# Patient Record
Sex: Male | Born: 1995 | Race: Black or African American | Hispanic: No | Marital: Single | Smoking: Never smoker
Health system: Southern US, Community
[De-identification: ages and names within clinical notes are randomized; demographics above are authoritative.]

## PROBLEM LIST (undated history)

## (undated) DIAGNOSIS — K219 Gastro-esophageal reflux disease without esophagitis: Secondary | ICD-10-CM

---

## 2021-05-25 ENCOUNTER — Other Ambulatory Visit: Payer: Self-pay

## 2021-05-25 ENCOUNTER — Emergency Department (HOSPITAL_COMMUNITY): Payer: Self-pay

## 2021-05-25 ENCOUNTER — Emergency Department (HOSPITAL_COMMUNITY)
Admission: EM | Admit: 2021-05-25 | Discharge: 2021-05-25 | Disposition: A | Discharge status: Home / Self Care | Payer: Self-pay | Attending: Emergency Medicine | Admitting: Emergency Medicine

## 2021-05-25 DIAGNOSIS — Z5321 Procedure and treatment not carried out due to patient leaving prior to being seen by health care provider: Secondary | ICD-10-CM | POA: Insufficient documentation

## 2021-05-25 DIAGNOSIS — M79642 Pain in left hand: Secondary | ICD-10-CM | POA: Insufficient documentation
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
## 2021-05-25 NOTE — ED Notes (Signed)
Pt not responding for vital recheck. 

## 2021-05-25 NOTE — ED Notes (Signed)
Pt called x3 for vitals recheck, still no response. Moving pt OTF. 

## 2021-10-21 ENCOUNTER — Ambulatory Visit (HOSPITAL_COMMUNITY)
Admission: EM | Admit: 2021-10-21 | Discharge: 2021-10-21 | Disposition: A | Discharge status: Home / Self Care | Payer: Self-pay | Attending: Internal Medicine | Admitting: Internal Medicine

## 2021-10-21 ENCOUNTER — Ambulatory Visit (INDEPENDENT_AMBULATORY_CARE_PROVIDER_SITE_OTHER): Payer: Self-pay

## 2021-10-21 ENCOUNTER — Encounter (HOSPITAL_COMMUNITY): Payer: Self-pay

## 2021-10-21 DIAGNOSIS — R1110 Vomiting, unspecified: Secondary | ICD-10-CM

## 2021-10-21 DIAGNOSIS — K5900 Constipation, unspecified: Secondary | ICD-10-CM

## 2021-10-21 DIAGNOSIS — R109 Unspecified abdominal pain: Secondary | ICD-10-CM

## 2021-10-21 DIAGNOSIS — R1084 Generalized abdominal pain: Secondary | ICD-10-CM

## 2021-10-21 MED ORDER — ONDANSETRON HCL 4 MG PO TABS: 4.0000 mg | ORAL_TABLET | Freq: Three times a day (TID) | ORAL | 0 refills | Status: DC | PRN

## 2021-10-21 NOTE — ED Triage Notes (Signed)
2-3 days ago, Pt reports after he ate from a restaurant he began to have abdominal pain, but could not have a BM. Today, Pt took pepto bismol and has emesis x2.  ?No diarrhea.  ?

## 2021-10-21 NOTE — ED Provider Notes (Signed)
?[facility] ? ? ? ?CSN: [id number] ?Arrival date & time: 10/21/21  1026 ? ? ?  ? ?History   ?Chief Complaint ?Chief Complaint  ?Patient presents with  ? Constipation  ? ? ?HPI ?Brad Lyons is a 26 y.o. male.  ? ?Abdominal pain ?Patient reports that for the last 2 to 3 days he has been having worsening abdominal pain ?This initially started after having a jalapeno cheeseburger ?States that he ate a steak the following day and states that he continued to have pain ?He states that the eating itself does not specifically worsen the pain, but he does not much of an appetite ?He has not had a bowel movement in the last 2 days ?States that the bowel movement was normal that time ?Denies any diarrhea ?States the pain worsened last night ?Pain is mostly in the epigastric region ?Started vomiting this morning, has had 2 episodes of emesis that is nonbloody per his report ?He tried some Pepto-Bismol without any improvement ?He is most bothered by the abdominal pain today ?He denies any fevers or chills ?No known sick contacts ?Reports normal urination has been drinking normally ?States that he has been able to eat small amounts, this does not specifically worsen the pain, but he does not feel like eating ? ? ?History reviewed. No pertinent past medical history. ? ?There are no problems to display for this patient. ? ? ?History reviewed. No pertinent surgical history. ? ? ? ? ?Home Medications   ? ?Prior to Admission medications   ?Medication Sig Start Date End Date Taking? Authorizing Provider  ?ondansetron (ZOFRAN) 4 MG tablet Take 1 tablet (4 mg total) by mouth every 8 (eight) hours as needed for nausea or vomiting. 10/21/21  Yes Katrena Stehlin, Bernita Raisin, DO  ? ? ?Family History ?Family History  ?Problem Relation Age of Onset  ? Healthy Father   ? ? ?Social History ?Social History  ? ?Tobacco Use  ? Smoking status: Every Day  ?  Types: Cigarettes, Cigars  ? Smokeless tobacco: Never  ? ? ? ?Allergies   ?Patient has no  known allergies. ? ? ?Review of Systems ?Review of Systems  ?All other systems reviewed and are negative. ?Per HPI ? ?Physical Exam ?Triage Vital Signs ?ED Triage Vitals  ?Enc Vitals Group  ?   BP 10/21/21 1054 (!) 148/102  ?   Pulse Rate 10/21/21 1054 85  ?   Resp 10/21/21 1054 20  ?   Temp 10/21/21 1054 98.2 ?F (36.8 ?C)  ?   Temp Source 10/21/21 1054 Oral  ?   SpO2 10/21/21 1054 97 %  ?   Weight --   ?   Height --   ?   Head Circumference --   ?   Peak Flow --   ?   Pain Score 10/21/21 1057 8  ?   Pain Loc --   ?   Pain Edu? --   ?   Excl. in [place]? --   ? ?No data found. ? ?Updated Vital Signs ?BP (!) 148/102 (BP Location: Right Arm)   Pulse 85   Temp 98.2 ?F (36.8 ?C) (Oral)   Resp 20   SpO2 97%  ? ?Visual Acuity ?Right Eye Distance:   ?Left Eye Distance:   ?Bilateral Distance:   ? ?Right Eye Near:   ?Left Eye Near:    ?Bilateral Near:    ? ?Physical Exam ?Constitutional:   ?   General: He is not in acute distress. ?  Appearance: Normal appearance. He is not ill-appearing or toxic-appearing.  ?HENT:  ?   Head: Normocephalic and atraumatic.  ?   Mouth/Throat:  ?   Mouth: Mucous membranes are moist.  ?Eyes:  ?   Conjunctiva/sclera: Conjunctivae normal.  ?Cardiovascular:  ?   Rate and Rhythm: Normal rate.  ?Pulmonary:  ?   Effort: Pulmonary effort is normal. No respiratory distress.  ?Abdominal:  ?   Palpations: Abdomen is soft.  ?   Tenderness: There is abdominal tenderness (diffuse, worse in b/l lower quadrants). There is no guarding or rebound.  ?   Comments: Bowel sounds difficult to auscultate  ?Musculoskeletal:  ?   Cervical back: Normal range of motion.  ?Skin: ?   General: Skin is warm and dry.  ?   Capillary Refill: Capillary refill takes less than 2 seconds.  ?Neurological:  ?   Mental Status: He is alert and oriented to person, place, and time.  ?Psychiatric:     ?   Mood and Affect: Mood normal.     ?   Behavior: Behavior normal.  ? ? ? ?UC Treatments / Results  ?Labs ?(all labs ordered are listed,  but only abnormal results are displayed) ?Labs Reviewed - No data to display ? ?EKG ? ? ?Radiology ?DG Abd 1 View ? ?Result Date: 10/21/2021 ?CLINICAL DATA:  Abdominal pain and vomiting. EXAM: ABDOMEN - 1 VIEW COMPARISON:  None. FINDINGS: Stool is seen in the cecum and ascending colon. Relative paucity of bowel gas elsewhere. IMPRESSION: Stool in the cecum and ascending colon may reflect constipation. Electronically Signed   By: Lorin Picket M.D.   On: 10/21/2021 11:15   ? ?Procedures ?Procedures (including critical care time) ? ?Medications Ordered in UC ?Medications - No data to display ? ?Initial Impression / Assessment and Plan / UC Course  ?I have reviewed the triage vital signs and the nursing notes. ? ?Pertinent labs & imaging results that were available during my care of the patient were reviewed by me and considered in my medical decision making (see chart for details). ? ?  ? ?Some stool noted on KUB but no specific findings of obstruction.  Recommend MiraLAX over-the-counter for constipation, 1-2 capfuls daily until bowel movement is obtained.  Recommend brat diet and good hydration.  Rx provided for Zofran to use as needed for nausea to maintain good p.o. intake.  If not improving, especially if pain significantly worsens, he is unable to eat or drink, he develops fever, recommend follow-up with a medical provider. ? ? ?Final Clinical Impressions(s) / UC Diagnoses  ? ?Final diagnoses:  ?Constipation, unspecified constipation type  ?Generalized abdominal pain  ? ? ? ?Discharge Instructions   ? ?  ?As we discussed, you should get MiraLAX which you can get over-the-counter at the pharmacy.  Take 1 capful today and then tonight if you have still not had a bowel movement you can take another.  You can take 1-2 capfuls a day until you have a bowel movement.  Make sure that you stay well-hydrated.  Eat a bland diet.  Things like bananas, rice, apples, toast are good for abdominal pain.  If you have significant  worsening of your pain, you are vomiting more, you develop a fever, you are still unable to have a bowel movement over the next few days, you should be seen by a medical provider again. ? ? ? ? ?ED Prescriptions   ? ? Medication Sig Dispense Auth. Provider  ? ondansetron [facility]) 4  MG tablet Take 1 tablet (4 mg total) by mouth every 8 (eight) hours as needed for nausea or vomiting. 12 tablet Vernell Townley, Bernita Raisin, DO  ? ?  ? ?PDMP not reviewed this encounter. ?  ?Cleophas Dunker, DO ?10/21/21 1139 ? ?

## 2021-10-21 NOTE — Discharge Instructions (Addendum)
As we discussed, you should get MiraLAX which you can get over-the-counter at the pharmacy.  Take 1 capful today and then tonight if you have still not had a bowel movement you can take another.  You can take 1-2 capfuls a day until you have a bowel movement.  Make sure that you stay well-hydrated.  Eat a bland diet.  Things like bananas, rice, apples, toast are good for abdominal pain.  If you have significant worsening of your pain, you are vomiting more, you develop a fever, you are still unable to have a bowel movement over the next few days, you should be seen by a medical provider again. ?

## 2021-10-25 ENCOUNTER — Other Ambulatory Visit: Payer: Self-pay

## 2021-10-25 ENCOUNTER — Encounter (HOSPITAL_COMMUNITY): Payer: Self-pay | Admitting: Emergency Medicine

## 2021-10-25 ENCOUNTER — Emergency Department (HOSPITAL_COMMUNITY): Payer: Self-pay | Admitting: Anesthesiology

## 2021-10-25 ENCOUNTER — Encounter (HOSPITAL_COMMUNITY): Admission: EM | Disposition: A | Discharge status: Home / Self Care | Payer: Self-pay | Source: Home / Self Care

## 2021-10-25 ENCOUNTER — Inpatient Hospital Stay (HOSPITAL_COMMUNITY): Payer: Self-pay

## 2021-10-25 ENCOUNTER — Inpatient Hospital Stay (HOSPITAL_COMMUNITY)
Admission: EM | Admit: 2021-10-25 | Discharge: 2021-11-03 | DRG: 339 | Disposition: A | Discharge status: Home / Self Care | Payer: Self-pay | Attending: Surgery | Admitting: Surgery

## 2021-10-25 ENCOUNTER — Emergency Department (HOSPITAL_COMMUNITY): Payer: Self-pay

## 2021-10-25 DIAGNOSIS — K3533 Acute appendicitis with perforation and localized peritonitis, with abscess: Principal | ICD-10-CM | POA: Diagnosis present

## 2021-10-25 DIAGNOSIS — J90 Pleural effusion, not elsewhere classified: Secondary | ICD-10-CM | POA: Diagnosis not present

## 2021-10-25 DIAGNOSIS — E860 Dehydration: Secondary | ICD-10-CM | POA: Diagnosis present

## 2021-10-25 DIAGNOSIS — F17210 Nicotine dependence, cigarettes, uncomplicated: Secondary | ICD-10-CM | POA: Diagnosis present

## 2021-10-25 DIAGNOSIS — E876 Hypokalemia: Secondary | ICD-10-CM | POA: Diagnosis not present

## 2021-10-25 DIAGNOSIS — K56609 Unspecified intestinal obstruction, unspecified as to partial versus complete obstruction: Secondary | ICD-10-CM | POA: Diagnosis present

## 2021-10-25 DIAGNOSIS — Z6838 Body mass index (BMI) 38.0-38.9, adult: Secondary | ICD-10-CM

## 2021-10-25 DIAGNOSIS — K3532 Acute appendicitis with perforation and localized peritonitis, without abscess: Secondary | ICD-10-CM

## 2021-10-25 DIAGNOSIS — E669 Obesity, unspecified: Secondary | ICD-10-CM | POA: Diagnosis present

## 2021-10-25 DIAGNOSIS — N179 Acute kidney failure, unspecified: Secondary | ICD-10-CM | POA: Diagnosis present

## 2021-10-25 DIAGNOSIS — K219 Gastro-esophageal reflux disease without esophagitis: Secondary | ICD-10-CM | POA: Diagnosis present

## 2021-10-25 DIAGNOSIS — K567 Ileus, unspecified: Secondary | ICD-10-CM | POA: Diagnosis not present

## 2021-10-25 HISTORY — PX: LAPAROSCOPY: SHX197

## 2021-10-25 HISTORY — DX: Gastro-esophageal reflux disease without esophagitis: K21.9

## 2021-10-25 HISTORY — PX: LAPAROSCOPIC APPENDECTOMY: SHX408

## 2021-10-25 LAB — URINALYSIS, ROUTINE W REFLEX MICROSCOPIC
Bacteria, UA: NONE SEEN
Bilirubin Urine: NEGATIVE
Glucose, UA: NEGATIVE mg/dL
Ketones, ur: 5 mg/dL — AB
Leukocytes,Ua: NEGATIVE
Nitrite: NEGATIVE
Protein, ur: 30 mg/dL — AB
Specific Gravity, Urine: >1.046 — ABNORMAL HIGH (ref 1.005–1.030)
pH: 6 (ref 5.0–8.0)

## 2021-10-25 LAB — COMPREHENSIVE METABOLIC PANEL
ALT: 26 U/L (ref 0–44)
AST: 21 U/L (ref 15–41)
Albumin: 3.7 g/dL (ref 3.5–5.0)
Alkaline Phosphatase: 96 U/L (ref 38–126)
Anion gap: 16 — ABNORMAL HIGH (ref 5–15)
BUN: 22 mg/dL — ABNORMAL HIGH (ref 6–20)
CO2: 25 mmol/L (ref 22–32)
Calcium: 9.5 mg/dL (ref 8.9–10.3)
Chloride: 90 mmol/L — ABNORMAL LOW (ref 98–111)
Creatinine, Ser: 1.8 mg/dL — ABNORMAL HIGH (ref 0.61–1.24)
GFR, Estimated: 52 mL/min — ABNORMAL LOW (ref >60)
Glucose, Bld: 171 mg/dL — ABNORMAL HIGH (ref 70–99)
Potassium: 3.5 mmol/L (ref 3.5–5.1)
Sodium: 131 mmol/L — ABNORMAL LOW (ref 135–145)
Total Bilirubin: 1.9 mg/dL — ABNORMAL HIGH (ref 0.3–1.2)
Total Protein: 8.6 g/dL — ABNORMAL HIGH (ref 6.5–8.1)

## 2021-10-25 LAB — CBC
HCT: 48.3 % (ref 39.0–52.0)
Hemoglobin: 17.8 g/dL — ABNORMAL HIGH (ref 13.0–17.0)
MCH: 29.5 pg (ref 26.0–34.0)
MCHC: 36.9 g/dL — ABNORMAL HIGH (ref 30.0–36.0)
MCV: 80.1 fL (ref 80.0–100.0)
Platelets: 341 10*3/uL (ref 150–400)
RBC: 6.03 MIL/uL — ABNORMAL HIGH (ref 4.22–5.81)
RDW: 12.7 % (ref 11.5–15.5)
WBC: 13.4 10*3/uL — ABNORMAL HIGH (ref 4.0–10.5)
nRBC: 0 % (ref 0.0–0.2)

## 2021-10-25 LAB — LIPASE, BLOOD: Lipase: 17 U/L (ref 11–51)

## 2021-10-25 SURGERY — LAPAROSCOPY, DIAGNOSTIC
Anesthesia: General | Site: Abdomen

## 2021-10-25 MED ORDER — KETAMINE HCL 10 MG/ML IJ SOLN
INTRAMUSCULAR | Status: DC | PRN
  Administered 2021-10-25: 30 mg via INTRAVENOUS
  Administered 2021-10-25: 20 mg via INTRAVENOUS

## 2021-10-25 MED ORDER — ACETAMINOPHEN 160 MG/5ML PO SOLN: 1000.0000 mg | Freq: Once | ORAL | Status: DC | PRN

## 2021-10-25 MED ORDER — ONDANSETRON HCL 4 MG/2ML IJ SOLN
INTRAMUSCULAR | Status: AC
  Filled 2021-10-25: qty 2

## 2021-10-25 MED ORDER — PROPOFOL 10 MG/ML IV BOLUS
INTRAVENOUS | Status: DC | PRN
  Administered 2021-10-25: 180 mg via INTRAVENOUS

## 2021-10-25 MED ORDER — DIPHENHYDRAMINE HCL 50 MG/ML IJ SOLN: 25.0000 mg | Freq: Four times a day (QID) | INTRAMUSCULAR | Status: DC | PRN

## 2021-10-25 MED ORDER — LACTATED RINGERS IV SOLN: INTRAVENOUS | Status: DC

## 2021-10-25 MED ORDER — ONDANSETRON HCL 4 MG/2ML IJ SOLN
4.0000 mg | Freq: Four times a day (QID) | INTRAMUSCULAR | Status: DC | PRN
  Administered 2021-10-25 – 2021-10-26 (×2): 4 mg via INTRAVENOUS
  Filled 2021-10-25: qty 2

## 2021-10-25 MED ORDER — PIPERACILLIN-TAZOBACTAM 3.375 G IVPB 30 MIN
3.3750 g | Freq: Once | INTRAVENOUS | Status: AC
  Administered 2021-10-25: 3.375 g via INTRAVENOUS
  Filled 2021-10-25: qty 50

## 2021-10-25 MED ORDER — ARTIFICIAL TEARS OPHTHALMIC OINT
TOPICAL_OINTMENT | OPHTHALMIC | Status: AC
  Filled 2021-10-25: qty 3.5

## 2021-10-25 MED ORDER — SUGAMMADEX SODIUM 200 MG/2ML IV SOLN
INTRAVENOUS | Status: DC | PRN
  Administered 2021-10-25: 300 mg via INTRAVENOUS

## 2021-10-25 MED ORDER — SUCCINYLCHOLINE CHLORIDE 200 MG/10ML IV SOSY
PREFILLED_SYRINGE | INTRAVENOUS | Status: AC
  Filled 2021-10-25: qty 10

## 2021-10-25 MED ORDER — MIDAZOLAM HCL 2 MG/2ML IJ SOLN
INTRAMUSCULAR | Status: DC | PRN
  Administered 2021-10-25: 2 mg via INTRAVENOUS

## 2021-10-25 MED ORDER — PIPERACILLIN-TAZOBACTAM 3.375 G IVPB
3.3750 g | Freq: Three times a day (TID) | INTRAVENOUS | Status: AC
  Administered 2021-10-25 – 2021-10-28 (×9): 3.375 g via INTRAVENOUS
  Filled 2021-10-25 (×9): qty 50

## 2021-10-25 MED ORDER — BUPIVACAINE-EPINEPHRINE (PF) 0.25% -1:200000 IJ SOLN
INTRAMUSCULAR | Status: AC
  Filled 2021-10-25: qty 30

## 2021-10-25 MED ORDER — OXYCODONE HCL 5 MG/5ML PO SOLN: 5.0000 mg | Freq: Once | ORAL | Status: DC | PRN

## 2021-10-25 MED ORDER — ONDANSETRON HCL 4 MG/2ML IJ SOLN
4.0000 mg | Freq: Once | INTRAMUSCULAR | Status: AC
Start: 2021-10-25 — End: 2021-10-25
  Administered 2021-10-25: 4 mg via INTRAVENOUS
  Filled 2021-10-25: qty 2

## 2021-10-25 MED ORDER — 0.9 % SODIUM CHLORIDE (POUR BTL) OPTIME
TOPICAL | Status: DC | PRN
  Administered 2021-10-25: 3000 mL

## 2021-10-25 MED ORDER — SODIUM CHLORIDE 0.9 % IR SOLN
Status: DC | PRN
  Administered 2021-10-25: 3000 mL

## 2021-10-25 MED ORDER — CHLORHEXIDINE GLUCONATE 0.12 % MT SOLN: 15.0000 mL | Freq: Once | OROMUCOSAL | Status: AC

## 2021-10-25 MED ORDER — MORPHINE SULFATE (PF) 2 MG/ML IV SOLN
2.0000 mg | INTRAVENOUS | Status: DC | PRN
  Filled 2021-10-25: qty 1

## 2021-10-25 MED ORDER — ACETAMINOPHEN 10 MG/ML IV SOLN
INTRAVENOUS | Status: DC | PRN
  Administered 2021-10-25: 1000 mg via INTRAVENOUS

## 2021-10-25 MED ORDER — ONDANSETRON HCL 4 MG/2ML IJ SOLN
4.0000 mg | Freq: Four times a day (QID) | INTRAMUSCULAR | Status: DC | PRN
  Filled 2021-10-25 (×2): qty 2

## 2021-10-25 MED ORDER — ORAL CARE MOUTH RINSE: 15.0000 mL | Freq: Once | OROMUCOSAL | Status: AC

## 2021-10-25 MED ORDER — ENOXAPARIN SODIUM 60 MG/0.6ML IJ SOSY
55.0000 mg | PREFILLED_SYRINGE | INTRAMUSCULAR | Status: DC
  Administered 2021-10-26 – 2021-10-30 (×4): 55 mg via SUBCUTANEOUS
  Filled 2021-10-25 (×4): qty 0.6

## 2021-10-25 MED ORDER — METHOCARBAMOL 1000 MG/10ML IJ SOLN
500.0000 mg | Freq: Three times a day (TID) | INTRAVENOUS | Status: DC | PRN
  Filled 2021-10-25: qty 5

## 2021-10-25 MED ORDER — MIDAZOLAM HCL 2 MG/2ML IJ SOLN
INTRAMUSCULAR | Status: AC
  Filled 2021-10-25: qty 2

## 2021-10-25 MED ORDER — SUCCINYLCHOLINE CHLORIDE 200 MG/10ML IV SOSY
PREFILLED_SYRINGE | INTRAVENOUS | Status: DC | PRN
  Administered 2021-10-25: 160 mg via INTRAVENOUS

## 2021-10-25 MED ORDER — DIPHENHYDRAMINE HCL 25 MG PO CAPS
25.0000 mg | ORAL_CAPSULE | Freq: Four times a day (QID) | ORAL | Status: DC | PRN
  Filled 2021-10-25: qty 1

## 2021-10-25 MED ORDER — FENTANYL CITRATE (PF) 100 MCG/2ML IJ SOLN
25.0000 ug | INTRAMUSCULAR | Status: DC | PRN
  Administered 2021-10-25 (×2): 25 ug via INTRAVENOUS

## 2021-10-25 MED ORDER — SODIUM CHLORIDE 0.9 % IV BOLUS
1000.0000 mL | Freq: Once | INTRAVENOUS | Status: AC
  Administered 2021-10-25: 1000 mL via INTRAVENOUS

## 2021-10-25 MED ORDER — ACETAMINOPHEN 10 MG/ML IV SOLN
1000.0000 mg | Freq: Four times a day (QID) | INTRAVENOUS | Status: AC
  Administered 2021-10-25 – 2021-10-26 (×4): 1000 mg via INTRAVENOUS
  Filled 2021-10-25 (×4): qty 100

## 2021-10-25 MED ORDER — ACETAMINOPHEN 10 MG/ML IV SOLN: 1000.0000 mg | Freq: Once | INTRAVENOUS | Status: DC | PRN

## 2021-10-25 MED ORDER — OXYCODONE HCL 5 MG PO TABS: 5.0000 mg | ORAL_TABLET | Freq: Once | ORAL | Status: DC | PRN

## 2021-10-25 MED ORDER — IOHEXOL 300 MG/ML  SOLN
100.0000 mL | Freq: Once | INTRAMUSCULAR | Status: AC | PRN
  Administered 2021-10-25: 100 mL via INTRAVENOUS

## 2021-10-25 MED ORDER — DEXAMETHASONE SODIUM PHOSPHATE 10 MG/ML IJ SOLN
INTRAMUSCULAR | Status: DC | PRN
  Administered 2021-10-25: 10 mg via INTRAVENOUS

## 2021-10-25 MED ORDER — FENTANYL CITRATE (PF) 100 MCG/2ML IJ SOLN
INTRAMUSCULAR | Status: AC
  Filled 2021-10-25: qty 2

## 2021-10-25 MED ORDER — MORPHINE SULFATE (PF) 2 MG/ML IV SOLN: 2.0000 mg | INTRAVENOUS | Status: DC | PRN

## 2021-10-25 MED ORDER — ACETAMINOPHEN 10 MG/ML IV SOLN
INTRAVENOUS | Status: AC
  Filled 2021-10-25: qty 100

## 2021-10-25 MED ORDER — CHLORHEXIDINE GLUCONATE 0.12 % MT SOLN
OROMUCOSAL | Status: AC
  Administered 2021-10-25: 15 mL via OROMUCOSAL
  Filled 2021-10-25: qty 15

## 2021-10-25 MED ORDER — BUPIVACAINE HCL (PF) 0.25 % IJ SOLN
INTRAMUSCULAR | Status: AC
  Filled 2021-10-25: qty 30

## 2021-10-25 MED ORDER — ONDANSETRON 4 MG PO TBDP: 4.0000 mg | ORAL_TABLET | Freq: Four times a day (QID) | ORAL | Status: DC | PRN

## 2021-10-25 MED ORDER — KETAMINE HCL 50 MG/5ML IJ SOSY
PREFILLED_SYRINGE | INTRAMUSCULAR | Status: AC
  Filled 2021-10-25: qty 5

## 2021-10-25 MED ORDER — LIDOCAINE 2% (20 MG/ML) 5 ML SYRINGE
INTRAMUSCULAR | Status: AC
  Filled 2021-10-25: qty 5

## 2021-10-25 MED ORDER — LIDOCAINE 2% (20 MG/ML) 5 ML SYRINGE
INTRAMUSCULAR | Status: DC | PRN
  Administered 2021-10-25: 80 mg via INTRAVENOUS

## 2021-10-25 MED ORDER — ROCURONIUM BROMIDE 10 MG/ML (PF) SYRINGE
PREFILLED_SYRINGE | INTRAVENOUS | Status: AC
  Filled 2021-10-25: qty 10

## 2021-10-25 MED ORDER — FENTANYL CITRATE (PF) 250 MCG/5ML IJ SOLN
INTRAMUSCULAR | Status: AC
  Filled 2021-10-25: qty 5

## 2021-10-25 MED ORDER — SODIUM CHLORIDE 0.9 % IV SOLN: INTRAVENOUS | Status: DC

## 2021-10-25 MED ORDER — PROPOFOL 10 MG/ML IV BOLUS
INTRAVENOUS | Status: AC
  Filled 2021-10-25: qty 20

## 2021-10-25 MED ORDER — FENTANYL CITRATE (PF) 250 MCG/5ML IJ SOLN
INTRAMUSCULAR | Status: DC | PRN
  Administered 2021-10-25: 50 ug via INTRAVENOUS
  Administered 2021-10-25: 150 ug via INTRAVENOUS
  Administered 2021-10-25: 50 ug via INTRAVENOUS

## 2021-10-25 MED ORDER — HYDRALAZINE HCL 20 MG/ML IJ SOLN: 10.0000 mg | INTRAMUSCULAR | Status: DC | PRN

## 2021-10-25 MED ORDER — ONDANSETRON HCL 4 MG/2ML IJ SOLN
INTRAMUSCULAR | Status: DC | PRN
  Administered 2021-10-25: 4 mg via INTRAVENOUS

## 2021-10-25 MED ORDER — DEXAMETHASONE SODIUM PHOSPHATE 10 MG/ML IJ SOLN
INTRAMUSCULAR | Status: AC
  Filled 2021-10-25: qty 1

## 2021-10-25 MED ORDER — ACETAMINOPHEN 500 MG PO TABS: 1000.0000 mg | ORAL_TABLET | Freq: Once | ORAL | Status: DC | PRN

## 2021-10-25 MED ORDER — ROCURONIUM BROMIDE 10 MG/ML (PF) SYRINGE
PREFILLED_SYRINGE | INTRAVENOUS | Status: DC | PRN
  Administered 2021-10-25 (×2): 50 mg via INTRAVENOUS

## 2021-10-25 MED ORDER — BUPIVACAINE-EPINEPHRINE 0.25% -1:200000 IJ SOLN
INTRAMUSCULAR | Status: DC | PRN
  Administered 2021-10-25: 10 mL
  Administered 2021-10-25: 5 mL

## 2021-10-25 MED ORDER — CHLORHEXIDINE GLUCONATE CLOTH 2 % EX PADS
6.0000 | MEDICATED_PAD | Freq: Every day | CUTANEOUS | Status: DC
  Administered 2021-10-26: 6 via TOPICAL

## 2021-10-25 SURGICAL SUPPLY — 70 items
BAG COUNTER SPONGE SURGICOUNT (BAG) ×3 IMPLANT
BIOPATCH BLUE 3/4IN DISK W/1.5 (GAUZE/BANDAGES/DRESSINGS) ×1 IMPLANT
BLADE CLIPPER SURG (BLADE) ×1 IMPLANT
CANISTER SUCT 3000ML PPV (MISCELLANEOUS) ×3 IMPLANT
CHLORAPREP W/TINT 26 (MISCELLANEOUS) ×3 IMPLANT
COVER SURGICAL LIGHT HANDLE (MISCELLANEOUS) ×3 IMPLANT
CUTTER FLEX LINEAR 45M (STAPLE) ×1 IMPLANT
DEFOGGER ANTIFOG KIT (MISCELLANEOUS) ×1 IMPLANT
DERMABOND ADVANCED (GAUZE/BANDAGES/DRESSINGS) ×1
DERMABOND ADVANCED .7 DNX12 (GAUZE/BANDAGES/DRESSINGS) ×2 IMPLANT
DRAIN CHANNEL 19F RND (DRAIN) ×1 IMPLANT
DRAPE LAPAROSCOPIC ABDOMINAL (DRAPES) ×3 IMPLANT
DRAPE WARM FLUID 44X44 (DRAPES) ×3 IMPLANT
DRSG OPSITE POSTOP 4X10 (GAUZE/BANDAGES/DRESSINGS) IMPLANT
DRSG OPSITE POSTOP 4X8 (GAUZE/BANDAGES/DRESSINGS) IMPLANT
DRSG TEGADERM 2-3/8X2-3/4 SM (GAUZE/BANDAGES/DRESSINGS) ×1 IMPLANT
ELECT BLADE 6.5 EXT (BLADE) IMPLANT
ELECT CAUTERY BLADE 6.4 (BLADE) ×3 IMPLANT
ELECT REM PT RETURN 9FT ADLT (ELECTROSURGICAL) ×3
ELECTRODE REM PT RTRN 9FT ADLT (ELECTROSURGICAL) ×2 IMPLANT
EVACUATOR SILICONE 100CC (DRAIN) ×1 IMPLANT
GLOVE SRG 8 PF TXTR STRL LF DI (GLOVE) ×2 IMPLANT
GLOVE SURG ENC MOIS LTX SZ7.5 (GLOVE) ×3 IMPLANT
GLOVE SURG UNDER POLY LF SZ8 (GLOVE) ×1
GOWN STRL REUS W/ TWL LRG LVL3 (GOWN DISPOSABLE) ×4 IMPLANT
GOWN STRL REUS W/ TWL XL LVL3 (GOWN DISPOSABLE) ×2 IMPLANT
GOWN STRL REUS W/TWL LRG LVL3 (GOWN DISPOSABLE) ×2
GOWN STRL REUS W/TWL XL LVL3 (GOWN DISPOSABLE) ×1
HANDLE SUCTION POOLE (INSTRUMENTS) ×2 IMPLANT
KIT BASIN OR (CUSTOM PROCEDURE TRAY) ×3 IMPLANT
KIT TURNOVER KIT B (KITS) ×3 IMPLANT
LIGASURE IMPACT 36 18CM CVD LR (INSTRUMENTS) IMPLANT
NDL HYPO 25GX1X1/2 BEV (NEEDLE) IMPLANT
NDL INSUFFLATION 14GA 120MM (NEEDLE) ×2 IMPLANT
NEEDLE HYPO 25GX1X1/2 BEV (NEEDLE) ×3 IMPLANT
NEEDLE INSUFFLATION 14GA 120MM (NEEDLE) ×3 IMPLANT
NS IRRIG 1000ML POUR BTL (IV SOLUTION) ×7 IMPLANT
PACK GENERAL/GYN (CUSTOM PROCEDURE TRAY) ×3 IMPLANT
PAD ARMBOARD 7.5X6 YLW CONV (MISCELLANEOUS) ×6 IMPLANT
PENCIL SMOKE EVACUATOR (MISCELLANEOUS) ×3 IMPLANT
POUCH RETRIEVAL ECOSAC 10 (ENDOMECHANICALS) IMPLANT
POUCH RETRIEVAL ECOSAC 10MM (ENDOMECHANICALS) ×1
RELOAD 45 VASCULAR/THIN (ENDOMECHANICALS) ×6 IMPLANT
RELOAD STAPLE 45 2.5 WHT GRN (ENDOMECHANICALS) IMPLANT
SCISSORS LAP 5X35 DISP (ENDOMECHANICALS) ×1 IMPLANT
SET IRRIG TUBING LAPAROSCOPIC (IRRIGATION / IRRIGATOR) ×1 IMPLANT
SET TUBE SMOKE EVAC HIGH FLOW (TUBING) ×3 IMPLANT
SHEARS HARMONIC ACE PLUS 36CM (ENDOMECHANICALS) ×1 IMPLANT
SLEEVE ENDOPATH XCEL 5M (ENDOMECHANICALS) ×4 IMPLANT
SPECIMEN JAR LARGE (MISCELLANEOUS) ×1 IMPLANT
SPONGE T-LAP 18X18 ~~LOC~~+RFID (SPONGE) ×3 IMPLANT
STAPLER VISISTAT 35W (STAPLE) ×3 IMPLANT
SUCTION POOLE HANDLE (INSTRUMENTS) ×3
SUT MNCRL AB 4-0 PS2 18 (SUTURE) ×3 IMPLANT
SUT PDS AB 1 TP1 54 (SUTURE) ×4 IMPLANT
SUT SILK 2 0 SH CR/8 (SUTURE) ×3 IMPLANT
SUT SILK 2 0 TIES 10X30 (SUTURE) ×3 IMPLANT
SUT SILK 3 0 SH CR/8 (SUTURE) ×3 IMPLANT
SUT SILK 3 0 TIES 10X30 (SUTURE) ×3 IMPLANT
SUT VICRYL 0 AB UR-6 (SUTURE) ×1 IMPLANT
TOWEL GREEN STERILE (TOWEL DISPOSABLE) ×3 IMPLANT
TOWEL GREEN STERILE FF (TOWEL DISPOSABLE) ×3 IMPLANT
TRAY FOLEY MTR SLVR 16FR STAT (SET/KITS/TRAYS/PACK) ×3 IMPLANT
TRAY LAPAROSCOPIC MC (CUSTOM PROCEDURE TRAY) ×3 IMPLANT
TROCAR XCEL 12X100 BLDLESS (ENDOMECHANICALS) ×1 IMPLANT
TROCAR XCEL BLUNT TIP 100MML (ENDOMECHANICALS) IMPLANT
TROCAR XCEL NON-BLD 11X100MML (ENDOMECHANICALS) IMPLANT
TROCAR XCEL NON-BLD 5MMX100MML (ENDOMECHANICALS) ×3 IMPLANT
WARMER LAPAROSCOPE (MISCELLANEOUS) ×3 IMPLANT
YANKAUER SUCT BULB TIP NO VENT (SUCTIONS) ×1 IMPLANT

## 2021-10-25 NOTE — Anesthesia Preprocedure Evaluation (Signed)
Anesthesia Evaluation  ?Patient identified by MRN, date of birth, ID band ?Patient awake ? ? ? ?Reviewed: ?Allergy & Precautions, NPO status , Patient's Chart, lab work & pertinent test results ? ?History of Anesthesia Complications ?Negative for: history of anesthetic complications ? ?Airway ?Mallampati: I ? ?TM Distance: >3 FB ?Neck ROM: Full ? ? ? Dental ? ?(+) Teeth Intact, Dental Advisory Given ?  ?Pulmonary ?neg shortness of breath, neg COPD, neg recent URI, Current Smoker and Patient abstained from smoking.,  ?  ?breath sounds clear to auscultation ? ? ? ? ? ? Cardiovascular ?negative cardio ROS ? ? ?Rhythm:Regular  ? ?  ?Neuro/Psych ?negative neurological ROS ? negative psych ROS  ? GI/Hepatic ?Neg liver ROS, perforated appendicitis ?  ?Endo/Other  ?negative endocrine ROS ? Renal/GU ?negative Renal ROS  ? ?  ?Musculoskeletal ?negative musculoskeletal ROS ?(+)  ? Abdominal ?  ?Peds ? Hematology ?negative hematology ROS ?(+)   ?Anesthesia Other Findings ? ? Reproductive/Obstetrics ? ?  ? ? ? ? ? ? ? ? ? ? ? ? ? ?  ?  ? ? ? ? ? ? ? ? ?Anesthesia Physical ?Anesthesia Plan ? ?ASA: 1 ? ?Anesthesia Plan: General  ? ?Post-op Pain Management: Toradol IV (intra-op)* and Ofirmev IV (intra-op)*  ? ?Induction: Rapid sequence and Cricoid pressure planned ? ?PONV Risk Score and Plan: 1 and Ondansetron and Dexamethasone ? ?Airway Management Planned: Oral ETT ? ?Additional Equipment: None ? ?Intra-op Plan:  ? ?Post-operative Plan: Extubation in OR ? ?Informed Consent: I have reviewed the patients History and Physical, chart, labs and discussed the procedure including the risks, benefits and alternatives for the proposed anesthesia with the patient or authorized representative who has indicated his/her understanding and acceptance.  ? ? ? ?Dental advisory given ? ?Plan Discussed with: CRNA ? ?Anesthesia Plan Comments:   ? ? ? ? ? ? ?Anesthesia Quick Evaluation ? ?

## 2021-10-25 NOTE — ED Provider Notes (Signed)
?[facility] ?Provider Note ? ? ?CSN: [id number] ?Arrival date & time: 10/25/21  0617 ? ?  ? ?History ? ?Chief Complaint  ?Patient presents with  ? Emesis  ? ? ?Brad Lyons is a 26 y.o. male. ? ?Pt began having abdominal pain on 4/2.  Pt treated for constipation with miralax.  Pt reports constipation resolved but abdominal pain has increased.  Pt complains of vomiting.   ? ?The history is provided by the patient. No language interpreter was used.  ?Emesis ?Severity:  Moderate ?Duration:  1 week ?Timing:  Constant ?Number of daily episodes:  Multiple ?Quality:  Unable to specify ?Able to tolerate:  Liquids ?Progression:  Worsening ?Chronicity:  New ?Relieved by:  Nothing ?Worsened by:  Nothing ?Associated symptoms: abdominal pain and fever   ?Risk factors: no prior abdominal surgery   ? ?  ? ?Home Medications ?Prior to Admission medications   ?Medication Sig Start Date End Date Taking? Authorizing Provider  ?ondansetron (ZOFRAN) 4 MG tablet Take 1 tablet (4 mg total) by mouth every 8 (eight) hours as needed for nausea or vomiting. 10/21/21   Meccariello, Solmon IceBailey J, DO  ?   ? ?Allergies    ?Patient has no known allergies.   ? ?Review of Systems   ?Review of Systems  ?Constitutional:  Positive for fever.  ?Gastrointestinal:  Positive for abdominal pain and vomiting.  ?All other systems reviewed and are negative. ? ?Physical Exam ?Updated Vital Signs ?BP 138/85   Pulse (!) 115   Temp 98.7 ?F (37.1 ?C) (Oral)   Resp (!) 27   Ht 5\' 6"  (1.676 m)   Wt 108.9 kg   SpO2 98%   BMI 38.74 kg/m?  ?Physical Exam ?Vitals and nursing note reviewed.  ?Constitutional:   ?   General: He is not in acute distress. ?   Appearance: He is well-developed. He is ill-appearing.  ?HENT:  ?   Head: Normocephalic and atraumatic.  ?   Nose: Nose normal.  ?   Mouth/Throat:  ?   Mouth: Mucous membranes are moist.  ?Eyes:  ?   Conjunctiva/sclera: Conjunctivae normal.  ?Cardiovascular:  ?   Rate and Rhythm:  Tachycardia present.  ?   Heart sounds: No murmur heard. ?Pulmonary:  ?   Effort: Pulmonary effort is normal. No respiratory distress.  ?   Breath sounds: Normal breath sounds.  ?Abdominal:  ?   Palpations: Abdomen is soft.  ?   Tenderness: There is abdominal tenderness.  ?Musculoskeletal:     ?   General: No swelling. Normal range of motion.  ?   Cervical back: Normal range of motion.  ?Skin: ?   General: Skin is warm and dry.  ?   Capillary Refill: Capillary refill takes less than 2 seconds.  ?Neurological:  ?   Mental Status: He is alert.  ?Psychiatric:     ?   Mood and Affect: Mood normal.  ? ? ?ED Results / Procedures / Treatments   ?Labs ?(all labs ordered are listed, but only abnormal results are displayed) ?Labs Reviewed  ?COMPREHENSIVE METABOLIC PANEL - Abnormal; Notable for the following components:  ?    Result Value  ? Sodium 131 (*)   ? Chloride 90 (*)   ? Glucose, Bld 171 (*)   ? BUN 22 (*)   ? Creatinine, Ser 1.80 (*)   ? Total Protein 8.6 (*)   ? Total Bilirubin 1.9 (*)   ? GFR, Estimated 52 (*)   ?  Anion gap 16 (*)   ? All other components within normal limits  ?CBC - Abnormal; Notable for the following components:  ? WBC 13.4 (*)   ? RBC 6.03 (*)   ? Hemoglobin 17.8 (*)   ? MCHC 36.9 (*)   ? All other components within normal limits  ?LIPASE, BLOOD  ?URINALYSIS, ROUTINE W REFLEX MICROSCOPIC  ? ? ?EKG ?None ? ?Radiology ?CT ABDOMEN PELVIS W CONTRAST ? ?Result Date: 10/25/2021 ?CLINICAL DATA:  Acute, nonlocalized abdominal pain EXAM: CT ABDOMEN AND PELVIS WITH CONTRAST TECHNIQUE: Multidetector CT imaging of the abdomen and pelvis was performed using the standard protocol following bolus administration of intravenous contrast. RADIATION DOSE REDUCTION: This exam was performed according to the departmental dose-optimization program which includes automated exposure control, adjustment of the mA and/or kV according to patient size and/or use of iterative reconstruction technique. CONTRAST:   OMNIPAQUE IOHEXOL 300 MG/ML  SOLN COMPARISON:  None. FINDINGS: Lower chest:  No contributory findings. Hepatobiliary: No focal liver abnormality.No evidence of biliary obstruction or stone. Pancreas: Unremarkable. Spleen: Unremarkable. Adrenals/Urinary Tract: Negative adrenals. No hydronephrosis or stone. Unremarkable bladder. Stomach/Bowel: Thick walled appendix with appendicoliths measuring up to 14 mm at the base. Apparent wall defect at the base adjacent to the largest stone with regional submucosal edema affecting distal ileum and cecum. Loculated fluid and gas in the right lateral abdomen with dominant fluid component measuring up to 4.2 cm transverse on axial slices and 7.6 cm craniocaudal on coronal reformats. Above the inflamed segments there is fluid and gas dilated bowel. Vascular/Lymphatic: No acute vascular abnormality. No mass or adenopathy. Reproductive:No pathologic findings. Other: No ascites or pneumoperitoneum. Small fatty supraumbilical hernia. Musculoskeletal: No acute abnormalities. Partially covered right femoral nail fixation Critical Value/emergent results were called by telephone at the time of interpretation on 10/25/2021 at 8:08 am to provider [facility] , who verbally acknowledged these results. IMPRESSION: 1. Perforated appendicitis with loculated but poorly encapsulated gas and fluid collection in the right abdomen measuring up to 8 x 4 cm. Appendicoliths are present. 2. Adjacent small and large bowel inflammation with lumen collapse causing upstream small bowel obstruction. Electronically Signed   By: Tiburcio Pea M.D.   On: 10/25/2021 08:07   ? ?Procedures ?Marland KitchenCritical Care ?Performed by: Elson Areas, PA-C ?Authorized by: Elson Areas, PA-C  ? ?Critical care provider statement:  ?  Critical care time (minutes):  30 ?  Critical care start time:  10/25/2021 7:00 AM ?  Critical care end time:  10/25/2021 9:02 AM ?  Critical care time was exclusive of:  Separately billable procedures  and treating other patients ?  Critical care was necessary to treat or prevent imminent or life-threatening deterioration of the following conditions:  Dehydration and renal failure ?  Critical care was time spent personally by me on the following activities:  Development of treatment plan with patient or surrogate, discussions with consultants, evaluation of patient's response to treatment, examination of patient, re-evaluation of patient's condition, ordering and review of laboratory studies and ordering and review of radiographic studies ?  Care discussed with: admitting provider    ? ? ?Medications Ordered in ED ?Medications  ?sodium chloride 0.9 % bolus 1,000 mL (1,000 mLs Intravenous New Bag/Given 10/25/21 0714)  ?ondansetron [facility]) injection 4 mg (4 mg Intravenous Given 10/25/21 0711)  ?iohexol (OMNIPAQUE) 300 MG/ML solution 100 mL (100 mLs Intravenous Contrast Given 10/25/21 0755)  ? ? ?ED Course/ Medical Decision Making/ A&P ?  ?                        ?  Medical Decision Making ?Pt complains of lower abdominal pain and vomiting  ? ?Amount and/or Complexity of Data Reviewed ?Independent Historian: spouse ?   Details: Pt has family with him. Reports pt not able to keep fluids down ?External Data Reviewed: notes. ?   Details: Urgent car notes reviewed ?Labs: ordered. Decision-making details documented in ED Course. ?   Details: labs ordered reviewed and discussed with pt  sodium 131 creatine 1.80 BUN 22   WBC 13.4 ?Radiology: ordered and independent interpretation performed. Decision-making details documented in ED Course. ?   Details: Ct shows rupture of appendix with bowel inflammation and small bowel obstruction ?Discussion of management or test interpretation with external provider(s): I discussed with Leary Roca  surgery PA who will see here for evaltuion ? ?Risk ?Prescription drug management. ?Decision regarding hospitalization. ?Emergency major surgery. ?Risk Details: Pt given IV fluids x 2 liters.  IV  zosyn to cover for complicated appendicitis.   ? ? ? ? ? ? ? ? ? ? ?Final Clinical Impression(s) / ED Diagnoses ?Final diagnoses:  ?Acute appendicitis with rupture  ?Small bowel obstruction (HCC)  ?Dehydration  ?Acute ki

## 2021-10-25 NOTE — Progress Notes (Signed)
Pt arrival to the floor with JP drain and foley. Hung NS at 125 and zosyn at this time. Call button and phone in reach. Gave pt swab to wet his mouth.  ? ?

## 2021-10-25 NOTE — H&P (Addendum)
? ? ? ?Brad LarocheElyjah Lyons ?30-Dec-1995  ?[id number].   ? ?Requesting MD: Trisha MangleKaren Sophia, PA-C ?Chief Complaint/Reason for Consult: Perforated appendicitis ? ?HPI: Brad Lyons is a 26 y.o. male who presented to the ED with abdominal pain.  Patient reports that he has been observing Brad Lyons.  After a large meal 1 week ago he began feeling bloated with some central abdominal discomfort, nausea, and constipation. He was seen in urgent care on 4/5 and prescribed Zofran/MiraLAX.  Since then his pain has become severe, generalized but greatest in his lower abdomen, particularly on the right.  He reports associated subjective fever, chills, nausea and vomiting.  He reports over the last 24-48 hours he has not been able to tolerate anything p.o.  Still passing flatus and had a liquidy BM this morning. Voiding less but still able to void and denies urinary symptoms otherwise. Presented to the ED and was found to be afebrile and tachycardic in the 120s.  No hypotension.  He was given 2L bolus of IVF without improvement of tachycardia.  WBC 13.4.  AKI with creatinine 1.8.  CT A/P with perforated appendicitis with appendicoliths present and loculated fluid collection measuring 8 x 4 cm.  There is also adjacent small bowel and large bowel inflammation with lumen collapse causing upstream SBO versus ileus. We were asked to see.  ? ?No prior abdominal surgeries. Patient is not on any blood thinners.  No past medical history or daily medication.  Smokes 1 pack/week.  Admits to marijuana use.  No other illicit drug use.  Occasional alcohol use.  Works at [employer].  Lives at home with his wife and son. Had some water at 5am otherwise has been npo since yesterday.  ? ?ROS: ?Review of Systems  ?Constitutional:  Positive for chills and fever.  ?Cardiovascular:  Negative for chest pain and leg swelling.  ?Gastrointestinal:  Positive for abdominal pain, constipation, diarrhea, nausea and vomiting.  ?Psychiatric/Behavioral:  Positive for  substance abuse.   ? ?Family History  ?Problem Relation Age of Onset  ? Healthy Father   ? ? ?History reviewed. No pertinent past medical history. ? ?History reviewed. No pertinent surgical history. ? ?Social History:  reports that he has been smoking cigarettes and cigars. He has never used smokeless tobacco. No history on file for alcohol use and drug use. ? ?Allergies: No Known Allergies ? ?(Not in a hospital admission) ? ? ? ?Physical Exam: ?Blood pressure 116/67, pulse (!) 116, temperature 98.7 ?F (37.1 ?C), temperature source Oral, resp. rate 20, height 5\' 6"  (1.676 m), weight 108.9 kg, SpO2 96 %. ?General: pleasant, WD/WN male who is laying in bed and appears uncomfortable ?HEENT: head is normocephalic, atraumatic.  Sclera are noninjected.  PERRL.  Ears and nose without any masses or lesions.  Mouth is pink and moist. Dentition fair ?Heart: Tachycardic with regular rhythm. Palpable pedal pulses bilaterally  ?Lungs: CTAB, no wheezes, rhonchi, or rales noted. Normal rate and effort   ?Abd: Distended, lower abdominal tenderness greatest in the RLQ with guarding, hypoactive BS. No masses, hernias, or organomegaly. No prior abdominal scars.  ?MS: no BUE/BLE edema, calves soft and nontender ?Skin: warm and dry with no masses, lesions, or rashes ?Psych: A&Ox4 with an appropriate affect ?Neuro: cranial nerves grossly intact, equal strength in BUE/BLE bilaterally, normal speech, thought process intact, moves all extremities, gait not assessed ? ? ?Results for orders placed or performed during the hospital encounter of 10/25/21 (from the past 48 hour(s))  ?Lipase, blood  Status: None  ? Collection Time: 10/25/21  6:28 AM  ?Result Value Ref Range  ? Lipase 17 11 - 51 U/L  ?  Comment: Performed at [facility], [address], [place], [place] [address]  ?Comprehensive metabolic panel     Status: Abnormal  ? Collection Time: 10/25/21  6:28 AM  ?Result Value Ref Range  ? Sodium 131 (L) 135 - 145 mmol/L  ?  Potassium 3.5 3.5 - 5.1 mmol/L  ? Chloride 90 (L) 98 - 111 mmol/L  ? CO2 25 22 - 32 mmol/L  ? Glucose, Bld 171 (H) 70 - 99 mg/dL  ?  Comment: Glucose reference range applies only to samples taken after fasting for at least 8 hours.  ? BUN 22 (H) 6 - 20 mg/dL  ? Creatinine, Ser 1.80 (H) 0.61 - 1.24 mg/dL  ? Calcium 9.5 8.9 - 10.3 mg/dL  ? Total Protein 8.6 (H) 6.5 - 8.1 g/dL  ? Albumin 3.7 3.5 - 5.0 g/dL  ? AST 21 15 - 41 U/L  ? ALT 26 0 - 44 U/L  ? Alkaline Phosphatase 96 38 - 126 U/L  ? Total Bilirubin 1.9 (H) 0.3 - 1.2 mg/dL  ? GFR, Estimated 52 (L) >60 mL/min  ?  Comment: (NOTE) ?Calculated using the CKD-EPI Creatinine Equation (2021) ?  ? Anion gap 16 (H) 5 - 15  ?  Comment: Performed at [facility], [address] [place], [place] [address]  ?CBC     Status: Abnormal  ? Collection Time: 10/25/21  6:28 AM  ?Result Value Ref Range  ? WBC 13.4 (H) 4.0 - 10.5 K/uL  ?  Comment: WHITE COUNT CONFIRMED ON SMEAR  ? RBC 6.03 (H) 4.22 - 5.81 MIL/uL  ? Hemoglobin 17.8 (H) 13.0 - 17.0 g/dL  ? HCT 48.3 39.0 - 52.0 %  ? MCV 80.1 80.0 - 100.0 fL  ? MCH 29.5 26.0 - 34.0 pg  ? MCHC 36.9 (H) 30.0 - 36.0 g/dL  ? RDW 12.7 11.5 - 15.5 %  ? Platelets 341 150 - 400 K/uL  ? nRBC 0.0 0.0 - 0.2 %  ?  Comment: Performed at [facility], [address], [place], [place] [address]  ?Urinalysis, Routine w reflex microscopic     Status: Abnormal  ? Collection Time: 10/25/21  8:25 AM  ?Result Value Ref Range  ? Color, Urine YELLOW YELLOW  ? APPearance CLEAR CLEAR  ? Specific Gravity, Urine >1.046 (H) 1.005 - 1.030  ? pH 6.0 5.0 - 8.0  ? Glucose, UA NEGATIVE NEGATIVE mg/dL  ? Hgb urine dipstick SMALL (A) NEGATIVE  ? Bilirubin Urine NEGATIVE NEGATIVE  ? Ketones, ur 5 (A) NEGATIVE mg/dL  ? Protein, ur 30 (A) NEGATIVE mg/dL  ? Nitrite NEGATIVE NEGATIVE  ? Leukocytes,Ua NEGATIVE NEGATIVE  ? RBC / HPF 0-5 0 - 5 RBC/hpf  ? WBC, UA 0-5 0 - 5 WBC/hpf  ? Bacteria, UA NONE SEEN NONE SEEN  ? Squamous Epithelial / LPF 0-5 0 - 5  ?  Comment:  Performed at [facility], [address], [place], [place] [address]  ? ?CT ABDOMEN PELVIS W CONTRAST ? ?Result Date: 10/25/2021 ?CLINICAL DATA:  Acute, nonlocalized abdominal pain EXAM: CT ABDOMEN AND PELVIS WITH CONTRAST TECHNIQUE: Multidetector CT imaging of the abdomen and pelvis was performed using the standard protocol following bolus administration of intravenous contrast. RADIATION DOSE REDUCTION: This exam was performed according to the departmental dose-optimization program which  includes automated exposure control, adjustment of the mA and/or kV according to patient size and/or use of iterative reconstruction technique. CONTRAST:  OMNIPAQUE IOHEXOL 300 MG/ML  SOLN COMPARISON:  None. FINDINGS: Lower chest:  No contributory findings. Hepatobiliary: No focal liver abnormality.No evidence of biliary obstruction or stone. Pancreas: Unremarkable. Spleen: Unremarkable. Adrenals/Urinary Tract: Negative adrenals. No hydronephrosis or stone. Unremarkable bladder. Stomach/Bowel: Thick walled appendix with appendicoliths measuring up to 14 mm at the base. Apparent wall defect at the base adjacent to the largest stone with regional submucosal edema affecting distal ileum and cecum. Loculated fluid and gas in the right lateral abdomen with dominant fluid component measuring up to 4.2 cm transverse on axial slices and 7.6 cm craniocaudal on coronal reformats. Above the inflamed segments there is fluid and gas dilated bowel. Vascular/Lymphatic: No acute vascular abnormality. No mass or adenopathy. Reproductive:No pathologic findings. Other: No ascites or pneumoperitoneum. Small fatty supraumbilical hernia. Musculoskeletal: No acute abnormalities. Partially covered right femoral nail fixation Critical Value/emergent results were called by telephone at the time of interpretation on 10/25/2021 at 8:08 am to provider [facility] , who verbally acknowledged these results. IMPRESSION: 1. Perforated appendicitis with  loculated but poorly encapsulated gas and fluid collection in the right abdomen measuring up to 8 x 4 cm. Appendicoliths are present. 2. Adjacent small and large bowel inflammation with lumen collapse causing upst

## 2021-10-25 NOTE — Anesthesia Procedure Notes (Signed)
Procedure Name: Intubation ?Date/Time: 10/25/2021 10:37 AM ?Performed by: Mayer Camel, CRNA ?Pre-anesthesia Checklist: Patient identified, Emergency Drugs available, Suction available and Patient being monitored ?Patient Re-evaluated:Patient Re-evaluated prior to induction ?Oxygen Delivery Method: Circle system utilized ?Preoxygenation: Pre-oxygenation with 100% oxygen ?Induction Type: IV induction and Rapid sequence ?Laryngoscope Size: Hyacinth Meeker and 2 ?Grade View: Grade I ?Tube type: Oral ?Tube size: 7.5 mm ?Number of attempts: 1 ?Airway Equipment and Method: Stylet ?Placement Confirmation: ETT inserted through vocal cords under direct vision, positive ETCO2 and breath sounds checked- equal and bilateral ?Secured at: 22 cm ?Tube secured with: Tape ?Dental Injury: Teeth and Oropharynx as per pre-operative assessment  ? ? ? ? ?

## 2021-10-25 NOTE — Progress Notes (Signed)
Pt states some intermittent nausea. Educated pt on use of the Facilities manager and he demonstrated good technique with it, up to 1250-1500. Foley with good urine output.  ?

## 2021-10-25 NOTE — Op Note (Signed)
? ?Patient: Dontae Minerva ([birth year], [id number]) ? ?Date of Surgery: 10/25/2021  ? ?Preoperative Diagnosis: Perforated appendicitis  ? ?Postoperative Diagnosis: Perforated appendicitis  ? ?Surgical Procedure:  ?APPENDECTOMY LAPAROSCOPIC ? ?Operative Team Members:  ?Surgeon(s) and Role: ?   * Francies Inch, Hyman Hopes, MD - Primary  ? ?Anesthesiologist: Val Eagle, MD ?CRNA: Mayer Camel, CRNA; Adria Dill, CRNA  ? ?Anesthesia: General  ? ?Fluids:  ?Total I/O ?In: 3235 [I.V.:2000; Other:90; IV Piggyback:1145] ?Out: 150 [Urine:100; Blood:50] ? ?Complications: None ? ?Drains:  (19 Fr) Jackson-Pratt drain(s) with closed bulb suction in the LLQ draining the operative field and right pericolic gutter   ? ?Nasogastric tube ?Foley catheter ? ?Specimen:  ?ID Type Source Tests Collected by Time Destination  ?1 : Appendix Tissue PATH Appendix SURGICAL PATHOLOGY Eirene Rather, Hyman Hopes, MD 10/25/2021 1138   ?  ? ?Disposition:  PACU - hemodynamically stable. ? ?Plan of Care: Admit to inpatient  ? ? ? ?Indications for Procedure: Iley Breeden is a 26 y.o. male who presented with abdominal pain.  History, physical and imaging was concerning for appendicitis, so laparoscopic appendectomy was recommended for the patient.  The procedure itself, as well as the risks, benefits and alternatives were discussed with the patient.  Risks discussed included but were not limited to the risk of bleeding, infection, damage to nearby structures, need to convert to open procedure, incisional hernia, and the need for additional procedures or surgeries.  With this discussion complete and all questions answered the patient granted consent to proceed. ? ?Findings: Perforated appendicitis with fecalith and purulent peritonitis ? ?Infection status: ?Patient: [facility] ?Case: Emergent ?Infection Present At Time Of Surgery (PATOS): Purulent Peritonitis ? ? ?Description of Procedure:  ? ?On the date stated above,  the patient was taken to the operating room suite and placed in supine positioning with the left arm tucked.  Sequential compression devices were placed on the lower extremities to prevent blood clots.  General endotracheal anesthesia was induced.  A foley catheter was placed to decompress the bladder, it was removed at the end of the case.   A nasogastric tube was placed.  Preoperative antibiotics were given.  The patient's abdomen was prepped and draped in the usual sterile fashion.  A time-out was completed verifying the correct patient, procedure, positioning and equipment needed for the case. ? ?We began by anesthetizing the skin with local anesthetic and then making a 5 mm incision just below the umbilicus.  We dissected through the subcutaneous tissues to the fascia.  The fascia was grasped and elevated using a Kocher clamp.  A Veress needle was inserted into the abdomen and the abdomen was insufflated to 15 mmHg.  A 5 mm trocar was inserted in this position under optical guidance and then the abdomen was inspected.  There was no trauma to the underlying viscera with initial trocar placement.  Any abnormal findings, other than inflammation in the right lower quadrant, are listed above in the findings section.  Two additional trocars were placed, one 5 mm trocar suprapubically and one 5 mm trocar in the left lower quadrant.  These were placed under direct vision without any trauma to the underlying viscera.   ? ?The patient was then placed in head down, left side down positioning.  There was purulent peritonitis throughout the abdomen.  The omentum was caked over the right lower quadrant and stuck to the abdominal wall.  This was bluntly lifted into the upper abdomen.  This  created some bleeding in the omentum which was controlled with a harmonic scalpel.  The appendix was identified and dissected free from its attachments to the abdominal wall, small intestine and cecum.   The mesoappendix was divided with  the harmonic scalpel.  The cecum was dissected off the abdominal side wall and lifted out of the retroperitoneum.  Two white loads of the 45 mm linear stapler were used to divide the appendix off the cecum taking a cuff of healthy cecum.  Three liters of saline were used to irrigate the abdomen and suction clean.  Despite this there was still adherent fibrinous exudate in the abdomen unable to be fully removed.  A 19 Fr JP drain was brought through the left lower quadrant and placed in the operative field and right pericolic gutter.  The abdomen was desufflated.  The umbilical trocar site was closed using two figure of 8 0-vicryl sutures using a UR6 needle.  The skin was closed with Monocryl and dermabond.  The patient was awoken from anesthesia and transferred to the post-anesthesia care unit in stable condition.  All sponge and needle counts were correct at the end of the case. ? ? ? ?Ivar Drape, MD ?General, Bariatric, & Minimally Invasive Surgery ?[facility], [place] ? ?

## 2021-10-25 NOTE — Progress Notes (Addendum)
PHARMACIST - PHYSICIAN COMMUNICATION ? ?CONCERNING:  Enoxaparin (Lovenox) for DVT Prophylaxis  ? ? ?RECOMMENDATION: ?Patient was prescribed enoxaprin 40mg  q24 hours for VTE prophylaxis.  ? ?Filed Weights  ? 10/25/21 12/25/21 10/25/21 0955  ?Weight: 108.9 kg (240 lb) 108.9 kg (240 lb 0.2 oz)  ? ? ?Body mass index is 38.76 kg/m?. ? ?Estimated Creatinine Clearance: 70.7 mL/min (A) (by C-G formula based on SCr of 1.8 mg/dL (H)). ? ? ?Based on [facility] policy patient is candidate for enoxaparin 0.5mg /kg TBW SQ every 24 hours based on BMI being >30. ? ?DESCRIPTION: ?Pharmacy has adjusted enoxaparin dose per [facility] policy. ? ?Patient is now receiving enoxaparin 55 mg every 24 hours  ? ? ?[facility], PharmD ?Clinical Pharmacist  ?10/25/2021 ?2:09 PM  ?

## 2021-10-25 NOTE — Transfer of Care (Signed)
Immediate Anesthesia Transfer of Care Note ? ?Patient: Brad Lyons ? ?Procedure(s) Performed: LAPAROSCOPY DIAGNOSTIC ?APPENDECTOMY LAPAROSCOPIC (Abdomen) ? ?Patient Location: PACU ? ?Anesthesia Type:General ? ?Level of Consciousness: awake, alert  and oriented ? ?Airway & Oxygen Therapy: Patient Spontanous Breathing and Patient connected to face mask oxygen ? ?Post-op Assessment: Report given to RN and Post -op Vital signs reviewed and stable ? ?Post vital signs: Reviewed and stable ? ?Last Vitals:  ?Vitals Value Taken Time  ?BP    ?Temp    ?Pulse 118   ?Resp    ?SpO2 98   ? ? ?Last Pain:  ?Vitals:  ? 10/25/21 0929  ?TempSrc: Oral  ?PainSc:   ?   ? ?  ? ?Complications: No notable events documented. ?

## 2021-10-25 NOTE — ED Triage Notes (Signed)
Pt c/o emesis x 2 days. Pt previously having c/o constipation.  ?

## 2021-10-25 NOTE — Anesthesia Postprocedure Evaluation (Signed)
Anesthesia Post Note ? ?Patient: Finlee Milo ? ?Procedure(s) Performed: LAPAROSCOPY DIAGNOSTIC ?APPENDECTOMY LAPAROSCOPIC (Abdomen) ? ?  ? ?Patient location during evaluation: PACU ?Anesthesia Type: General ?Level of consciousness: patient cooperative and awake ?Pain management: pain level controlled ?Vital Signs Assessment: post-procedure vital signs reviewed and stable ?Respiratory status: spontaneous breathing, nonlabored ventilation, respiratory function stable and patient connected to nasal cannula oxygen ?Cardiovascular status: blood pressure returned to baseline and stable ?Postop Assessment: no apparent nausea or vomiting ?Anesthetic complications: no ? ? ?No notable events documented. ? ?Last Vitals:  ?Vitals:  ? 10/25/21 1335 10/25/21 1405  ?BP: (!) 132/92 135/87  ?Pulse: (!) 107 (!) 107  ?Resp: (!) 21 18  ?Temp: (!) 36.3 ?C 36.8 ?C  ?SpO2: 95% 93%  ?  ?Last Pain:  ?Vitals:  ? 10/25/21 1405  ?TempSrc: Oral  ?PainSc:   ? ? ?  ?  ?  ?  ?  ?  ? ?Pellegrino Kennard ? ? ? ? ?

## 2021-10-26 ENCOUNTER — Inpatient Hospital Stay (HOSPITAL_COMMUNITY): Payer: Self-pay

## 2021-10-26 LAB — CBC
HCT: 40.4 % (ref 39.0–52.0)
Hemoglobin: 14.1 g/dL (ref 13.0–17.0)
MCH: 28.1 pg (ref 26.0–34.0)
MCHC: 34.9 g/dL (ref 30.0–36.0)
MCV: 80.6 fL (ref 80.0–100.0)
Platelets: 305 10*3/uL (ref 150–400)
RBC: 5.01 MIL/uL (ref 4.22–5.81)
RDW: 12.6 % (ref 11.5–15.5)
WBC: 17.4 10*3/uL — ABNORMAL HIGH (ref 4.0–10.5)
nRBC: 0 % (ref 0.0–0.2)

## 2021-10-26 LAB — BASIC METABOLIC PANEL
Anion gap: 8 (ref 5–15)
BUN: 16 mg/dL (ref 6–20)
CO2: 27 mmol/L (ref 22–32)
Calcium: 8.5 mg/dL — ABNORMAL LOW (ref 8.9–10.3)
Chloride: 101 mmol/L (ref 98–111)
Creatinine, Ser: 1.15 mg/dL (ref 0.61–1.24)
GFR, Estimated: >60 mL/min (ref >60)
Glucose, Bld: 126 mg/dL — ABNORMAL HIGH (ref 70–99)
Potassium: 3.5 mmol/L (ref 3.5–5.1)
Sodium: 136 mmol/L (ref 135–145)

## 2021-10-26 LAB — HIV ANTIBODY (ROUTINE TESTING W REFLEX): HIV Screen 4th Generation wRfx: NONREACTIVE

## 2021-10-26 MED ORDER — ALUM & MAG HYDROXIDE-SIMETH 200-200-20 MG/5ML PO SUSP
30.0000 mL | ORAL | Status: DC | PRN
  Administered 2021-10-26: 30 mL via ORAL
  Filled 2021-10-26: qty 30

## 2021-10-26 MED ORDER — LACTATED RINGERS IV BOLUS
1000.0000 mL | Freq: Once | INTRAVENOUS | Status: AC
  Administered 2021-10-26: 1000 mL via INTRAVENOUS

## 2021-10-26 MED ORDER — SODIUM CHLORIDE 0.9 % IV SOLN
12.5000 mg | Freq: Four times a day (QID) | INTRAVENOUS | Status: DC | PRN
  Administered 2021-10-26: 12.5 mg via INTRAVENOUS
  Filled 2021-10-26: qty 0.5

## 2021-10-26 MED ORDER — PANTOPRAZOLE SODIUM 40 MG PO TBEC: 40.0000 mg | DELAYED_RELEASE_TABLET | Freq: Every day | ORAL | Status: DC

## 2021-10-26 NOTE — Progress Notes (Addendum)
? ?Progress Note ? ?1 Day Post-Op  ?Subjective: ?Passing flatus but having a lot of nausea and vomiting despite zofran. No BM. Has hiccoughs and dry heaves while I am in his room. Pain is well controlled and not needing a lot of pain medications. Ambulating.  ? ?Fiance is bedside ? ?Objective: ?Vital signs in last 24 hours: ?Temp:  [97.4 ?F (36.3 ?C)-98.4 ?F (36.9 ?C)] 97.7 ?F (36.5 ?C) (04/10 3151) ?Pulse Rate:  [98-112] 104 (04/10 0843) ?Resp:  [18-30] 18 (04/10 0843) ?BP: (112-149)/(79-105) 149/105 (04/10 0843) ?SpO2:  [92 %-97 %] 94 % (04/10 0843) ?Weight:  [108.9 kg] 108.9 kg (04/09 0955) ?Last BM Date : 10/24/21 ? ?Intake/Output from previous day: ?04/09 0701 - 04/10 0700 ?In: 5040.5 [I.V.:3381.3; IV Piggyback:1444.3] ?Out: 1160 [Urine:950; Drains:160; Blood:50] ?Intake/Output this shift: ?No intake/output data recorded. ? ?PE: ?General: pleasant, WD, male who is laying in bed in NAD ?HEENT: head is normocephalic, atraumatic. Mouth is pink and moist ?Lungs: Respiratory effort nonlabored on room air ?Abd: soft, mild to moderate distension. TTP at umbilicus and LLQ around drain. Incision with dermabond d/d/I. Drain cloudy serosanguinous. Hypoactive bowel sounds ?MSK: all 4 extremities are symmetrical with no cyanosis, clubbing, or edema. ?Skin: warm and dry with no masses, lesions, or rashes ?Psych: A&Ox3 with an appropriate affect.  ? ? ?Lab Results:  ?Recent Labs  ?  10/25/21 ?0628  ?WBC 13.4*  ?HGB 17.8*  ?HCT 48.3  ?PLT 341  ? ?BMET ?Recent Labs  ?  10/25/21 ?0628  ?NA 131*  ?K 3.5  ?CL 90*  ?CO2 25  ?GLUCOSE 171*  ?BUN 22*  ?CREATININE 1.80*  ?CALCIUM 9.5  ? ?PT/INR ?No results for input(s): LABPROT, INR in the last 72 hours. ?CMP  ?   ?Component Value Date/Time  ? NA 131 (L) 10/25/2021 7616  ? K 3.5 10/25/2021 0628  ? CL 90 (L) 10/25/2021 0737  ? CO2 25 10/25/2021 0628  ? GLUCOSE 171 (H) 10/25/2021 1062  ? BUN 22 (H) 10/25/2021 6948  ? CREATININE 1.80 (H) 10/25/2021 5462  ? CALCIUM 9.5 10/25/2021 0628  ?  PROT 8.6 (H) 10/25/2021 7035  ? ALBUMIN 3.7 10/25/2021 0628  ? AST 21 10/25/2021 0628  ? ALT 26 10/25/2021 0628  ? ALKPHOS 96 10/25/2021 0628  ? BILITOT 1.9 (H) 10/25/2021 0093  ? GFRNONAA 52 (L) 10/25/2021 8182  ? ?Lipase  ?   ?Component Value Date/Time  ? LIPASE 17 10/25/2021 0628  ? ? ? ? ? ?Studies/Results: ?CT ABDOMEN PELVIS W CONTRAST ? ?Result Date: 10/25/2021 ?CLINICAL DATA:  Acute, nonlocalized abdominal pain EXAM: CT ABDOMEN AND PELVIS WITH CONTRAST TECHNIQUE: Multidetector CT imaging of the abdomen and pelvis was performed using the standard protocol following bolus administration of intravenous contrast. RADIATION DOSE REDUCTION: This exam was performed according to the departmental dose-optimization program which includes automated exposure control, adjustment of the mA and/or kV according to patient size and/or use of iterative reconstruction technique. CONTRAST:  OMNIPAQUE IOHEXOL 300 MG/ML  SOLN COMPARISON:  None. FINDINGS: Lower chest:  No contributory findings. Hepatobiliary: No focal liver abnormality.No evidence of biliary obstruction or stone. Pancreas: Unremarkable. Spleen: Unremarkable. Adrenals/Urinary Tract: Negative adrenals. No hydronephrosis or stone. Unremarkable bladder. Stomach/Bowel: Thick walled appendix with appendicoliths measuring up to 14 mm at the base. Apparent wall defect at the base adjacent to the largest stone with regional submucosal edema affecting distal ileum and cecum. Loculated fluid and gas in the right lateral abdomen with dominant fluid component measuring up to 4.2 cm  transverse on axial slices and 7.6 cm craniocaudal on coronal reformats. Above the inflamed segments there is fluid and gas dilated bowel. Vascular/Lymphatic: No acute vascular abnormality. No mass or adenopathy. Reproductive:No pathologic findings. Other: No ascites or pneumoperitoneum. Small fatty supraumbilical hernia. Musculoskeletal: No acute abnormalities. Partially covered right femoral  nail fixation Critical Value/emergent results were called by telephone at the time of interpretation on 10/25/2021 at 8:08 am to provider [facility] , who verbally acknowledged these results. IMPRESSION: 1. Perforated appendicitis with loculated but poorly encapsulated gas and fluid collection in the right abdomen measuring up to 8 x 4 cm. Appendicoliths are present. 2. Adjacent small and large bowel inflammation with lumen collapse causing upstream small bowel obstruction. Electronically Signed   By: Tiburcio Pea M.D.   On: 10/25/2021 08:07   ? ?Anti-infectives: ?Anti-infectives (From admission, onward)  ? ? Start     Dose/Rate Route Frequency Ordered Stop  ? 10/25/21 1500  piperacillin-tazobactam (ZOSYN) IVPB 3.375 g       ? 3.375 g ?12.5 mL/hr over 240 Minutes Intravenous Every 8 hours 10/25/21 1341 11/01/21 1359  ? 10/25/21 0830  piperacillin-tazobactam (ZOSYN) IVPB 3.375 g       ? 3.375 g ?100 mL/hr over 30 Minutes Intravenous  Once 10/25/21 0815 10/25/21 0924  ? ?  ? ? ? ?Assessment/Plan ?Perforated appendicitis ?POD1 s/p laparoscopic appendectomy 4/9 Dr. Dossie Der  ?- JP drain with 160 SS. Continue drain at discharge ?- likely developing ileus. Continue prn zofran and have added thorazine for hiccoughs/recurrent nausea. Will order NGT. Continue NPO ?- continue IV abx ?- Cr elevated to 1.8 on admit. Continue IVF. Recheck labs pending ?- encouraged ambulation ? ?FEN: NPO/NGT, NS @ 125 ml/hr ?ID: zosyn 4/9>> ?VTE: lovenox ? ? ? ? LOS: 1 day  ? ?Eric Form, PA-C ?[facility] ?10/26/2021, 9:39 AM ?Please see Amion for pager number during day hours 7:00am-4:30pm ? ?

## 2021-10-26 NOTE — Progress Notes (Addendum)
XR results for NGT placement verification pending. ?

## 2021-10-26 NOTE — Progress Notes (Signed)
16 Fr. NGT inserted to L nare. Pt tolerated well. Moderate amount of green gastric content immediately emptied from NGT. XR for verification of placement pending at this time. XR department notified and order previously placed by MD. ? ?

## 2021-10-26 NOTE — Progress Notes (Signed)
Discussed plan to place NG tube with pt and significant other at bedside. Although hesitant, pt agreeable to plan. He request to have thorazine prior to NGT inserted. Awaiting dose from pharmacy at this time. Pt remains stable and awake with persistent N/V at this time. ?

## 2021-10-27 LAB — CBC
HCT: 41.9 % (ref 39.0–52.0)
Hemoglobin: 15.2 g/dL (ref 13.0–17.0)
MCH: 29.1 pg (ref 26.0–34.0)
MCHC: 36.3 g/dL — ABNORMAL HIGH (ref 30.0–36.0)
MCV: 80.1 fL (ref 80.0–100.0)
Platelets: 323 10*3/uL (ref 150–400)
RBC: 5.23 MIL/uL (ref 4.22–5.81)
RDW: 12.9 % (ref 11.5–15.5)
WBC: 23 10*3/uL — ABNORMAL HIGH (ref 4.0–10.5)
nRBC: 0 % (ref 0.0–0.2)

## 2021-10-27 LAB — BASIC METABOLIC PANEL
Anion gap: 12 (ref 5–15)
BUN: 12 mg/dL (ref 6–20)
CO2: 26 mmol/L (ref 22–32)
Calcium: 8.8 mg/dL — ABNORMAL LOW (ref 8.9–10.3)
Chloride: 99 mmol/L (ref 98–111)
Creatinine, Ser: 1.11 mg/dL (ref 0.61–1.24)
GFR, Estimated: >60 mL/min (ref >60)
Glucose, Bld: 86 mg/dL (ref 70–99)
Potassium: 3 mmol/L — ABNORMAL LOW (ref 3.5–5.1)
Sodium: 137 mmol/L (ref 135–145)

## 2021-10-27 LAB — SURGICAL PATHOLOGY

## 2021-10-27 MED ORDER — ACETAMINOPHEN 10 MG/ML IV SOLN
1000.0000 mg | Freq: Four times a day (QID) | INTRAVENOUS | Status: AC
  Administered 2021-10-27 – 2021-10-28 (×4): 1000 mg via INTRAVENOUS
  Filled 2021-10-27 (×4): qty 100

## 2021-10-27 MED ORDER — POTASSIUM CHLORIDE CRYS ER 20 MEQ PO TBCR
40.0000 meq | EXTENDED_RELEASE_TABLET | Freq: Two times a day (BID) | ORAL | Status: AC
  Administered 2021-10-27 (×2): 40 meq via ORAL
  Filled 2021-10-27 (×2): qty 2

## 2021-10-27 MED ORDER — MORPHINE SULFATE (PF) 2 MG/ML IV SOLN
2.0000 mg | INTRAVENOUS | Status: DC | PRN
  Administered 2021-10-29 – 2021-10-30 (×5): 2 mg via INTRAVENOUS
  Administered 2021-10-31: 4 mg via INTRAVENOUS
  Administered 2021-10-31 (×3): 2 mg via INTRAVENOUS
  Administered 2021-11-01 – 2021-11-02 (×2): 4 mg via INTRAVENOUS
  Filled 2021-10-27: qty 1
  Filled 2021-10-27: qty 2
  Filled 2021-10-27 (×6): qty 1
  Filled 2021-10-27 (×2): qty 2
  Filled 2021-10-27 (×2): qty 1
  Filled 2021-10-27 (×2): qty 2

## 2021-10-27 MED ORDER — POTASSIUM CHLORIDE 10 MEQ/100ML IV SOLN
10.0000 meq | INTRAVENOUS | Status: AC
  Administered 2021-10-27 (×4): 10 meq via INTRAVENOUS
  Filled 2021-10-27 (×3): qty 100

## 2021-10-27 NOTE — Progress Notes (Signed)
[facility] ?Progress Note ? ?2 Days Post-Op  ?Subjective: ?CC-  ?Up on bedside commode. Feeling better than yesterday. States that he started passing flatus and has had several Bms. Abdominal bloating improving. Asking when he can eat. ? ?Objective: ?Vital signs in last 24 hours: ?Temp:  [98 ?F (36.7 ?C)-98.6 ?F (37 ?C)] 98.2 ?F (36.8 ?C) (04/11 4235) ?Pulse Rate:  [91-107] 102 (04/11 0737) ?Resp:  [18] 18 (04/11 0737) ?BP: (132-148)/(80-89) 148/88 (04/11 0737) ?SpO2:  [96 %-97 %] 97 % (04/11 0737) ?Last BM Date : 10/26/21 ? ?Intake/Output from previous day: ?04/10 0701 - 04/11 0700 ?In: 1420.7 [I.V.:1320.7; IV Piggyback:100] ?Out: 2710 [Urine:2250; Emesis/NG output:350; Drains:110] ?Intake/Output this shift: ?No intake/output data recorded. ? ?PE: ?Gen:  Alert, NAD ?Abd: soft, mild distension, nontender. Incisions cdi with possible trace erythema at periumbilical incision but no drainage. Drain cloudy serosanguinous.  ? ?Lab Results:  ?Recent Labs  ?  10/25/21 ?0628 10/26/21 ?1413  ?WBC 13.4* 17.4*  ?HGB 17.8* 14.1  ?HCT 48.3 40.4  ?PLT 341 305  ? ?BMET ?Recent Labs  ?  10/25/21 ?0628 10/26/21 ?1413  ?NA 131* 136  ?K 3.5 3.5  ?CL 90* 101  ?CO2 25 27  ?GLUCOSE 171* 126*  ?BUN 22* 16  ?CREATININE 1.80* 1.15  ?CALCIUM 9.5 8.5*  ? ?PT/INR ?No results for input(s): LABPROT, INR in the last 72 hours. ?CMP  ?   ?Component Value Date/Time  ? NA 136 10/26/2021 1413  ? K 3.5 10/26/2021 1413  ? CL 101 10/26/2021 1413  ? CO2 27 10/26/2021 1413  ? GLUCOSE 126 (H) 10/26/2021 1413  ? BUN 16 10/26/2021 1413  ? CREATININE 1.15 10/26/2021 1413  ? CALCIUM 8.5 (L) 10/26/2021 1413  ? PROT 8.6 (H) 10/25/2021 3614  ? ALBUMIN 3.7 10/25/2021 0628  ? AST 21 10/25/2021 0628  ? ALT 26 10/25/2021 0628  ? ALKPHOS 96 10/25/2021 0628  ? BILITOT 1.9 (H) 10/25/2021 4315  ? GFRNONAA >60 10/26/2021 1413  ? ?Lipase  ?   ?Component Value Date/Time  ? LIPASE 17 10/25/2021 0628  ? ? ? ? ? ?Studies/Results: ?DG Abd Portable 1V ? ?Result  Date: 10/26/2021 ?CLINICAL DATA:  Confirm NG tube placement. EXAM: PORTABLE ABDOMEN - 1 VIEW COMPARISON:  Abdominopelvic CT earlier today FINDINGS: Tip and side port of the enteric tube below the diaphragm in the stomach. There is gaseous distention of small bowel loops in the central upper abdomen. IMPRESSION: Tip and side port of the enteric tube below the diaphragm in the stomach. Electronically Signed   By: Narda Rutherford M.D.   On: 10/26/2021 15:43   ? ?Anti-infectives: ?Anti-infectives (From admission, onward)  ? ? Start     Dose/Rate Route Frequency Ordered Stop  ? 10/25/21 1500  piperacillin-tazobactam (ZOSYN) IVPB 3.375 g       ? 3.375 g ?12.5 mL/hr over 240 Minutes Intravenous Every 8 hours 10/25/21 1341 10/28/21 1359  ? 10/25/21 0830  piperacillin-tazobactam (ZOSYN) IVPB 3.375 g       ? 3.375 g ?100 mL/hr over 30 Minutes Intravenous  Once 10/25/21 0815 10/25/21 0924  ? ?  ? ? ? ?Assessment/Plan ?Perforated appendicitis ?POD2 s/p laparoscopic appendectomy 4/9 Dr. Dossie Der  ?- JP drain with 110 cloudy/SS. Continue drain at discharge ?- ileus seems to be resolving. Clamp NG and allow sips of clears ?- continue mobilizing ?- continue abx x72 hours postop ?- labs pending this AM ?  ?FEN: clamp NGT, sips, NS @ 125 ml/hr ?ID: zosyn 4/9>> ?VTE:  lovenox ? ? ? LOS: 2 days  ? ? ?Franne Forts, PA-C ?[facility] ?10/27/2021, 11:21 AM ?Please see Amion for pager number during day hours 7:00am-4:30pm ? ?

## 2021-10-27 NOTE — Progress Notes (Signed)
Pt found to be in the shower with NGT unsecured. Re-taped/secured NGT  at this time. Pt had disconnected himself from IVF and ABX with pump running into sink. I instructed pt to call for assistance d/t risk of NGT becoming dislodged. Attempted to assist pt back to bed but he is refusing until he is done with his shower. ?

## 2021-10-27 NOTE — TOC Initial Note (Signed)
Transition of Care (TOC) - Initial/Assessment Note  ? ? ?Patient Details  ?Name: Brad Lyons ?MRN: [id number] ?Date of Birth: June 11, 1996 ? ?Transition of Care (TOC) CM/SW Contact:    ?Kingsley Plan, RN ?Phone Number: ?10/27/2021, 1:09 PM ? ?Clinical Narrative:                 ? ?Patient from home.  ? ?Confirmed face sheet information.  ? ?Patient does not have a PCP, interested in a [facility]. Once he establishes care with a [facility] he can use pharmacy  at [facility]. At discharge scripts can be sent top [facility] pharmacy. Pharmacy will call patient with cost . If cost is to high and patient cannot afford can see if he is eligible for [facility] program. Patient voiced understanding. ? ?Placed a Artist consult and provided medicaid information  .  ? ?Called [facility] , [facility] , and Primary Care at [facility] , their providers like to see hospital follow up patients within 7 to 14 days of discharge, they have no appointments available at present. Instructed to call back closer to discharge.  ? ?[facility] left message.  ?Expected Discharge Plan: Home/Self Care ?Barriers to Discharge: Continued Medical Work up ? ? ?Patient Goals and CMS Choice ?Patient states their goals for this hospitalization and ongoing recovery are:: to return to home ?CMS Medicare.gov Compare Post Acute Care list provided to:: Patient ?  ? ?Expected Discharge Plan and Services ?Expected Discharge Plan: Home/Self Care ?  ?Discharge Planning Services: CM Consult, [facility], [facility] Program, Medication Assistance ?  ?Living arrangements for the past 2 months: Single Family Home ?                ?DME Arranged: N/A ?DME Agency: NA ?  ?  ?  ?HH Arranged: NA ?  ?  ?  ?  ? ?Prior Living Arrangements/Services ?Living arrangements for the past 2 months: Single Family Home ?  ?Patient language and need for interpreter reviewed:: Yes ?Do you feel  safe going back to the place where you live?: Yes      ?Need for Family Participation in Patient Care: Yes (Comment) ?Care giver support system in place?: Yes (comment) ?  ?Criminal Activity/Legal Involvement Pertinent to Current Situation/Hospitalization: No - Comment as needed ? ?Activities of Daily Living ?  ?  ? ?Permission Sought/Granted ?  ?Permission granted to share information with : No ?   ?   ?   ?   ? ?Emotional Assessment ?Appearance:: Appears stated age ?Attitude/Demeanor/Rapport: Engaged ?Affect (typically observed): Accepting ?Orientation: : Oriented to Self, Oriented to Place, Oriented to  Time, Oriented to Situation ?Alcohol / Substance Use: Not Applicable ?Psych Involvement: No (comment) ? ?Admission diagnosis:  Dehydration [E86.0] ?Small bowel obstruction (HCC) [K56.609] ?Acute appendicitis with rupture [K35.32] ?Acute kidney injury (HCC) [N17.9] ?Acute perforated appendicitis [K35.32] ?Patient Active Problem List  ? Diagnosis Date Noted  ? Acute perforated appendicitis 10/25/2021  ? ?PCP:  Pcp, No ?Pharmacy:   ?[facility] [phone] - [place], [place] - [phone] AT [address] ?[address] ? [place] [address] ?Phone: [phone] Fax: [phone] ? ?[facility] ?[address] ?[place] [address] ?Phone: [phone] Fax: [phone] ? ? ? ? ?Social Determinants of Health (SDOH) Interventions ?  ? ?Readmission Risk Interventions ?   ? View : No data to display.  ?  ?  ?  ? ? ? ?

## 2021-10-28 LAB — BASIC METABOLIC PANEL
Anion gap: 12 (ref 5–15)
BUN: 6 mg/dL (ref 6–20)
CO2: 25 mmol/L (ref 22–32)
Calcium: 8.6 mg/dL — ABNORMAL LOW (ref 8.9–10.3)
Chloride: 99 mmol/L (ref 98–111)
Creatinine, Ser: 1.06 mg/dL (ref 0.61–1.24)
GFR, Estimated: >60 mL/min (ref >60)
Glucose, Bld: 112 mg/dL — ABNORMAL HIGH (ref 70–99)
Potassium: 3.1 mmol/L — ABNORMAL LOW (ref 3.5–5.1)
Sodium: 136 mmol/L (ref 135–145)

## 2021-10-28 LAB — CBC
HCT: 41.1 % (ref 39.0–52.0)
Hemoglobin: 14.5 g/dL (ref 13.0–17.0)
MCH: 28.2 pg (ref 26.0–34.0)
MCHC: 35.3 g/dL (ref 30.0–36.0)
MCV: 80 fL (ref 80.0–100.0)
Platelets: 327 10*3/uL (ref 150–400)
RBC: 5.14 MIL/uL (ref 4.22–5.81)
RDW: 13.1 % (ref 11.5–15.5)
WBC: 26 10*3/uL — ABNORMAL HIGH (ref 4.0–10.5)
nRBC: 0 % (ref 0.0–0.2)

## 2021-10-28 LAB — MAGNESIUM: Magnesium: 2 mg/dL (ref 1.7–2.4)

## 2021-10-28 MED ORDER — SIMETHICONE 80 MG PO CHEW
80.0000 mg | CHEWABLE_TABLET | Freq: Four times a day (QID) | ORAL | Status: DC | PRN
  Administered 2021-10-30 – 2021-10-31 (×2): 80 mg via ORAL
  Filled 2021-10-28 (×3): qty 1

## 2021-10-28 MED ORDER — POTASSIUM CHLORIDE 10 MEQ/100ML IV SOLN
10.0000 meq | INTRAVENOUS | Status: AC
  Administered 2021-10-28 (×4): 10 meq via INTRAVENOUS
  Filled 2021-10-28 (×2): qty 100

## 2021-10-28 MED ORDER — BOOST / RESOURCE BREEZE PO LIQD CUSTOM
1.0000 | Freq: Three times a day (TID) | ORAL | Status: DC
  Administered 2021-10-28 – 2021-11-03 (×13): 1 via ORAL

## 2021-10-28 MED ORDER — POTASSIUM CHLORIDE CRYS ER 20 MEQ PO TBCR
40.0000 meq | EXTENDED_RELEASE_TABLET | Freq: Two times a day (BID) | ORAL | Status: AC
  Administered 2021-10-28 (×2): 40 meq via ORAL
  Filled 2021-10-28 (×2): qty 2

## 2021-10-28 MED ORDER — ACETAMINOPHEN 500 MG PO TABS
1000.0000 mg | ORAL_TABLET | Freq: Three times a day (TID) | ORAL | Status: DC
  Administered 2021-10-28 – 2021-11-02 (×9): 1000 mg via ORAL
  Filled 2021-10-28 (×16): qty 2

## 2021-10-28 MED ORDER — PIPERACILLIN-TAZOBACTAM 3.375 G IVPB
3.3750 g | Freq: Three times a day (TID) | INTRAVENOUS | Status: DC
Start: 2021-10-28 — End: 2021-11-03
  Administered 2021-10-28 – 2021-11-03 (×18): 3.375 g via INTRAVENOUS
  Filled 2021-10-28 (×18): qty 50

## 2021-10-28 NOTE — Progress Notes (Signed)
[facility] ?Progress Note ? ?3 Days Post-Op  ?Subjective: ?CC-  ?Up on bedside commode. States that he is having some gas pains. Feels bloated intermittently but denies any nausea or vomiting. Tolerating liquids. Passing a lot of flatus and having loose Bms.  ?Labs pending. ? ?Objective: ?Vital signs in last 24 hours: ?Temp:  [99.4 ?F (37.4 ?C)-99.5 ?F (37.5 ?C)] 99.4 ?F (37.4 ?C) (04/12 0630) ?Pulse Rate:  [101-102] 102 (04/12 0630) ?Resp:  [18] 18 (04/12 0630) ?BP: (143-150)/(95-97) 150/97 (04/12 0630) ?SpO2:  [97 %-98 %] 97 % (04/12 0630) ?Last BM Date : 10/27/21 ? ?Intake/Output from previous day: ?04/11 0701 - 04/12 0700 ?In: 2493.9 [P.O.:240; I.V.:1877.6; IV Piggyback:376.3] ?Out: 85 [Urine:500; Emesis/NG output:100; Drains:120] ?Intake/Output this shift: ?No intake/output data recorded. ? ?PE: ?Gen:  Alert, NAD ?Abd: soft, mild distension, nontender. Incisions cdi. Drain cloudy serosanguinous.  ? ?Lab Results:  ?Recent Labs  ?  10/26/21 ?1413 10/27/21 ?1149  ?WBC 17.4* 23.0*  ?HGB 14.1 15.2  ?HCT 40.4 41.9  ?PLT 305 323  ? ?BMET ?Recent Labs  ?  10/26/21 ?1413 10/27/21 ?1149  ?NA 136 137  ?K 3.5 3.0*  ?CL 101 99  ?CO2 27 26  ?GLUCOSE 126* 86  ?BUN 16 12  ?CREATININE 1.15 1.11  ?CALCIUM 8.5* 8.8*  ? ?PT/INR ?No results for input(s): LABPROT, INR in the last 72 hours. ?CMP  ?   ?Component Value Date/Time  ? NA 137 10/27/2021 1149  ? K 3.0 (L) 10/27/2021 1149  ? CL 99 10/27/2021 1149  ? CO2 26 10/27/2021 1149  ? GLUCOSE 86 10/27/2021 1149  ? BUN 12 10/27/2021 1149  ? CREATININE 1.11 10/27/2021 1149  ? CALCIUM 8.8 (L) 10/27/2021 1149  ? PROT 8.6 (H) 10/25/2021 [id number]  ? ALBUMIN 3.7 10/25/2021 0628  ? AST 21 10/25/2021 0628  ? ALT 26 10/25/2021 0628  ? ALKPHOS 96 10/25/2021 0628  ? BILITOT 1.9 (H) 10/25/2021 [id number]  ? GFRNONAA >60 10/27/2021 1149  ? ?Lipase  ?   ?Component Value Date/Time  ? LIPASE 17 10/25/2021 0628  ? ? ? ? ? ?Studies/Results: ?DG Abd Portable 1V ? ?Result Date: 10/26/2021 ?CLINICAL  DATA:  Confirm NG tube placement. EXAM: PORTABLE ABDOMEN - 1 VIEW COMPARISON:  Abdominopelvic CT earlier today FINDINGS: Tip and side port of the enteric tube below the diaphragm in the stomach. There is gaseous distention of small bowel loops in the central upper abdomen. IMPRESSION: Tip and side port of the enteric tube below the diaphragm in the stomach. Electronically Signed   By: Keith Rake M.D.   On: 10/26/2021 15:43   ? ?Anti-infectives: ?Anti-infectives (From admission, onward)  ? ? Start     Dose/Rate Route Frequency Ordered Stop  ? 10/25/21 1500  piperacillin-tazobactam (ZOSYN) IVPB 3.375 g       ? 3.375 g ?12.5 mL/hr over 240 Minutes Intravenous Every 8 hours 10/25/21 1341 10/28/21 0911  ? 10/25/21 0830  piperacillin-tazobactam (ZOSYN) IVPB 3.375 g       ? 3.375 g ?100 mL/hr over 30 Minutes Intravenous  Once 10/25/21 0815 10/25/21 0924  ? ?  ? ? ? ?Assessment/Plan ?Perforated appendicitis ?POD#3 s/p laparoscopic appendectomy 4/9 Dr. Thermon Leyland  ?- JP drain with 120cc cloudy/SS. Continue drain at discharge ?- Having bowel function but also some gas pains. Continue mobilizing. Add simethicone. Brandon for full liquids.  ?- labs pending. If WBC still elevated will plan to continue IV antibiotics longer than initially planned 72 hours postop ?  ?FEN: FLD,  Boost, NS @ 125 ml/hr ?ID: zosyn 4/9>> ?VTE: lovenox ? ? ? LOS: 3 days  ? ? ?Wellington Hampshire, PA-C ?[facility] ?10/28/2021, 9:38 AM ?Please see Amion for pager number during day hours 7:00am-4:30pm ? ?

## 2021-10-29 LAB — BASIC METABOLIC PANEL
Anion gap: 9 (ref 5–15)
BUN: <5 mg/dL — ABNORMAL LOW (ref 6–20)
CO2: 26 mmol/L (ref 22–32)
Calcium: 8.4 mg/dL — ABNORMAL LOW (ref 8.9–10.3)
Chloride: 102 mmol/L (ref 98–111)
Creatinine, Ser: 1 mg/dL (ref 0.61–1.24)
GFR, Estimated: >60 mL/min (ref >60)
Glucose, Bld: 106 mg/dL — ABNORMAL HIGH (ref 70–99)
Potassium: 3.3 mmol/L — ABNORMAL LOW (ref 3.5–5.1)
Sodium: 137 mmol/L (ref 135–145)

## 2021-10-29 LAB — CBC
HCT: 39.5 % (ref 39.0–52.0)
Hemoglobin: 14.3 g/dL (ref 13.0–17.0)
MCH: 29.1 pg (ref 26.0–34.0)
MCHC: 36.2 g/dL — ABNORMAL HIGH (ref 30.0–36.0)
MCV: 80.3 fL (ref 80.0–100.0)
Platelets: 366 10*3/uL (ref 150–400)
RBC: 4.92 MIL/uL (ref 4.22–5.81)
RDW: 13.2 % (ref 11.5–15.5)
WBC: 29.2 10*3/uL — ABNORMAL HIGH (ref 4.0–10.5)
nRBC: 0 % (ref 0.0–0.2)

## 2021-10-29 MED ORDER — KCL IN DEXTROSE-NACL 20-5-0.45 MEQ/L-%-% IV SOLN
INTRAVENOUS | Status: DC
  Filled 2021-10-29 (×5): qty 1000

## 2021-10-29 NOTE — TOC Progression Note (Signed)
Transition of Care (TOC) - Progression Note  ? ? ?Patient Details  ?Name: Keanon Bevins ?MRN: [id number] ?Date of Birth: [birth year] ? ?Transition of Care (TOC) CM/SW Contact  ?Lockie Pares, RN ?Phone Number: ?10/29/2021, 10:43 AM ? ?Clinical Narrative:    ? ?WBC climbing remains on IV antibiotics. Will likely have drain stay post discharge and need drain/ wound clinic assistance.  Will need to make an appointment for patient to F/U with [facility] tomorrow. ? ?CM will continue to follow for needs, recommendations, and transitions. ?Expected Discharge Plan: Home/Self Care ?Barriers to Discharge: Continued Medical Work up ? ?Expected Discharge Plan and Services ?Expected Discharge Plan: Home/Self Care ?  ?Discharge Planning Services: CM Consult, [facility], [facility] Program, Medication Assistance ?  ?Living arrangements for the past 2 months: Single Family Home ?                ?DME Arranged: N/A ?DME Agency: NA ?  ?  ?  ?HH Arranged: NA ?  ?  ?  ?  ? ? ?Social Determinants of Health (SDOH) Interventions ?  ? ?Readmission Risk Interventions ?   ? View : No data to display.  ?  ?  ?  ? ? ?

## 2021-10-29 NOTE — Progress Notes (Signed)
[facility] ?Progress Note ? ?4 Days Post-Op  ?Subjective: ?CC-  ?Tired this morning. Abdominal pain improved. Still somewhat bloated but improved. Passing flatus and having loose stools. Denies n/v. Tolerating full liquids. ?Labs pending this morning. ? ?Objective: ?Vital signs in last 24 hours: ?Temp:  [97.9 ?F (36.6 ?C)-99 ?F (37.2 ?C)] 97.9 ?F (36.6 ?C) (04/13 7106) ?Pulse Rate:  [96-100] 100 (04/13 2694) ?Resp:  [17] 17 (04/13 8546) ?BP: (138-143)/(83-90) 143/90 (04/13 2703) ?SpO2:  [98 %] 98 % (04/13 5009) ?Last BM Date : 10/28/21 ? ?Intake/Output from previous day: ?04/12 0701 - 04/13 0700 ?In: 3790.1 [P.O.:1320; I.V.:2470.1] ?Out: 620 [Drains:20; Stool:600] ?Intake/Output this shift: ?No intake/output data recorded. ? ?PE: ?Gen:  Alert, NAD ?Abd: soft, mild distension, nontender. Incisions cdi. Drain cloudy serosanguinous.  ? ?Lab Results:  ?Recent Labs  ?  10/27/21 ?1149 10/28/21 ?0920  ?WBC 23.0* 26.0*  ?HGB 15.2 14.5  ?HCT 41.9 41.1  ?PLT 323 327  ? ?BMET ?Recent Labs  ?  10/27/21 ?1149 10/28/21 ?0920  ?NA 137 136  ?K 3.0* 3.1*  ?CL 99 99  ?CO2 26 25  ?GLUCOSE 86 112*  ?BUN 12 6  ?CREATININE 1.11 1.06  ?CALCIUM 8.8* 8.6*  ? ?PT/INR ?No results for input(s): LABPROT, INR in the last 72 hours. ?CMP  ?   ?Component Value Date/Time  ? NA 136 10/28/2021 0920  ? K 3.1 (L) 10/28/2021 0920  ? CL 99 10/28/2021 0920  ? CO2 25 10/28/2021 0920  ? GLUCOSE 112 (H) 10/28/2021 0920  ? BUN 6 10/28/2021 0920  ? CREATININE 1.06 10/28/2021 0920  ? CALCIUM 8.6 (L) 10/28/2021 0920  ? PROT 8.6 (H) 10/25/2021 3818  ? ALBUMIN 3.7 10/25/2021 0628  ? AST 21 10/25/2021 0628  ? ALT 26 10/25/2021 0628  ? ALKPHOS 96 10/25/2021 0628  ? BILITOT 1.9 (H) 10/25/2021 2993  ? GFRNONAA >60 10/28/2021 0920  ? ?Lipase  ?   ?Component Value Date/Time  ? LIPASE 17 10/25/2021 0628  ? ? ? ? ? ?Studies/Results: ?No results found. ? ?Anti-infectives: ?Anti-infectives (From admission, onward)  ? ? Start     Dose/Rate Route Frequency  Ordered Stop  ? 10/28/21 1300  piperacillin-tazobactam (ZOSYN) IVPB 3.375 g       ? 3.375 g ?12.5 mL/hr over 240 Minutes Intravenous Every 8 hours 10/28/21 1022    ? 10/25/21 1500  piperacillin-tazobactam (ZOSYN) IVPB 3.375 g       ? 3.375 g ?12.5 mL/hr over 240 Minutes Intravenous Every 8 hours 10/25/21 1341 10/28/21 0911  ? 10/25/21 0830  piperacillin-tazobactam (ZOSYN) IVPB 3.375 g       ? 3.375 g ?100 mL/hr over 30 Minutes Intravenous  Once 10/25/21 0815 10/25/21 0924  ? ?  ? ? ? ?Assessment/Plan ?Perforated appendicitis ?POD#4 s/p laparoscopic appendectomy 4/9 Dr. Dossie Der  ?- JP drain with 20cc cloudy/SS. Continue drain at discharge ?- Having bowel function but still somewhat bloated. Ok for soft diet but take slowly. Continue mobilizing. ?- labs pending. Continue IV antibiotics ?  ?FEN: soft, Boost, decrease IVF 50 ml/hr ?ID: zosyn 4/9>> ?VTE: lovenox ? ? LOS: 4 days  ? ? ?Franne Forts, PA-C ?[facility] ?10/29/2021, 8:44 AM ?Please see Amion for pager number during day hours 7:00am-4:30pm ? ?

## 2021-10-29 NOTE — Plan of Care (Signed)

## 2021-10-30 ENCOUNTER — Inpatient Hospital Stay (HOSPITAL_COMMUNITY): Payer: Self-pay

## 2021-10-30 LAB — BASIC METABOLIC PANEL
Anion gap: 8 (ref 5–15)
BUN: <5 mg/dL — ABNORMAL LOW (ref 6–20)
CO2: 25 mmol/L (ref 22–32)
Calcium: 8.4 mg/dL — ABNORMAL LOW (ref 8.9–10.3)
Chloride: 102 mmol/L (ref 98–111)
Creatinine, Ser: 0.97 mg/dL (ref 0.61–1.24)
GFR, Estimated: >60 mL/min (ref >60)
Glucose, Bld: 110 mg/dL — ABNORMAL HIGH (ref 70–99)
Potassium: 3 mmol/L — ABNORMAL LOW (ref 3.5–5.1)
Sodium: 135 mmol/L (ref 135–145)

## 2021-10-30 LAB — CBC
HCT: 37.9 % — ABNORMAL LOW (ref 39.0–52.0)
Hemoglobin: 13.5 g/dL (ref 13.0–17.0)
MCH: 28.9 pg (ref 26.0–34.0)
MCHC: 35.6 g/dL (ref 30.0–36.0)
MCV: 81.2 fL (ref 80.0–100.0)
Platelets: 401 10*3/uL — ABNORMAL HIGH (ref 150–400)
RBC: 4.67 MIL/uL (ref 4.22–5.81)
RDW: 13.5 % (ref 11.5–15.5)
WBC: 26.6 10*3/uL — ABNORMAL HIGH (ref 4.0–10.5)
nRBC: 0 % (ref 0.0–0.2)

## 2021-10-30 LAB — MAGNESIUM: Magnesium: 1.9 mg/dL (ref 1.7–2.4)

## 2021-10-30 MED ORDER — POTASSIUM CHLORIDE CRYS ER 20 MEQ PO TBCR
40.0000 meq | EXTENDED_RELEASE_TABLET | Freq: Two times a day (BID) | ORAL | Status: DC
  Administered 2021-10-30 – 2021-10-31 (×3): 40 meq via ORAL
  Filled 2021-10-30 (×4): qty 2

## 2021-10-30 MED ORDER — POTASSIUM CHLORIDE 10 MEQ/100ML IV SOLN
10.0000 meq | INTRAVENOUS | Status: AC
  Administered 2021-10-30 (×4): 10 meq via INTRAVENOUS
  Filled 2021-10-30 (×4): qty 100

## 2021-10-30 MED ORDER — HYDROCORTISONE 0.5 % EX CREA
TOPICAL_CREAM | Freq: Once | CUTANEOUS | Status: DC
  Filled 2021-10-30: qty 28.35

## 2021-10-30 MED ORDER — IOHEXOL 300 MG/ML  SOLN
100.0000 mL | Freq: Once | INTRAMUSCULAR | Status: AC | PRN
  Administered 2021-10-30: 100 mL via INTRAVENOUS

## 2021-10-30 NOTE — Progress Notes (Addendum)
[facility] ?Progress Note ? ?5 Days Post-Op  ?Subjective: ?CC-  ?Abdominal pain improving. States that he feels hungry. Denies n/v. Passing flatus and having multiple loose stools.  ?Coughing up some phlegm. States that he feels a little short of breath at times. This is typically when he needs water and improves when he drinks something. Denies chest pain. Denies pain/swelling of extremities. Denies dysuria. ? ?Objective: ?Vital signs in last 24 hours: ?Temp:  [99.2 ?F (37.3 ?C)-100.4 ?F (38 ?C)] 99.8 ?F (37.7 ?C) (04/14 [id number]) ?Pulse Rate:  [92-105] 92 (04/14 0648) ?Resp:  [18] 18 (04/14 [id number]) ?BP: (133-143)/(89-92) 140/90 (04/14 [id number]) ?SpO2:  [95 %-99 %] 99 % (04/14 0648) ?Last BM Date : 10/29/21 ? ?Intake/Output from previous day: ?04/13 0701 - 04/14 0700 ?In: 1696.6 [P.O.:1380; I.V.:156.2; IV Piggyback:160.4] ?Out: 1500 [Urine:1500] ?Intake/Output this shift: ?No intake/output data recorded. ? ?PE: ?Gen:  Alert, NAD ?Cardio: mild tachy, low 100s ?Pulm: CTAB, no wheezing or rhonchi, rate and effort normal ?Abd: soft, mild distension, nontender. Incisions cdi. Drain cloudy serosanguinous.  ?Msk: calves soft and nontender without edema ? ?Lab Results:  ?Recent Labs  ?  10/28/21 ?0920 10/29/21 ?0943  ?WBC 26.0* 29.2*  ?HGB 14.5 14.3  ?HCT 41.1 39.5  ?PLT 327 366  ? ?BMET ?Recent Labs  ?  10/28/21 ?0920 10/29/21 ?0943  ?NA 136 137  ?K 3.1* 3.3*  ?CL 99 102  ?CO2 25 26  ?GLUCOSE 112* 106*  ?BUN 6 <5*  ?CREATININE 1.06 1.00  ?CALCIUM 8.6* 8.4*  ? ?PT/INR ?No results for input(s): LABPROT, INR in the last 72 hours. ?CMP  ?   ?Component Value Date/Time  ? NA 137 10/29/2021 0943  ? K 3.3 (L) 10/29/2021 0943  ? CL 102 10/29/2021 0943  ? CO2 26 10/29/2021 0943  ? GLUCOSE 106 (H) 10/29/2021 0943  ? BUN <5 (L) 10/29/2021 0943  ? CREATININE 1.00 10/29/2021 0943  ? CALCIUM 8.4 (L) 10/29/2021 0943  ? PROT 8.6 (H) 10/25/2021 [id number]  ? ALBUMIN 3.7 10/25/2021 0628  ? AST 21 10/25/2021 0628  ? ALT 26 10/25/2021 0628  ?  ALKPHOS 96 10/25/2021 0628  ? BILITOT 1.9 (H) 10/25/2021 [id number]  ? GFRNONAA >60 10/29/2021 0943  ? ?Lipase  ?   ?Component Value Date/Time  ? LIPASE 17 10/25/2021 0628  ? ? ? ? ? ?Studies/Results: ?No results found. ? ?Anti-infectives: ?Anti-infectives (From admission, onward)  ? ? Start     Dose/Rate Route Frequency Ordered Stop  ? 10/28/21 1300  piperacillin-tazobactam (ZOSYN) IVPB 3.375 g       ? 3.375 g ?12.5 mL/hr over 240 Minutes Intravenous Every 8 hours 10/28/21 1022    ? 10/25/21 1500  piperacillin-tazobactam (ZOSYN) IVPB 3.375 g       ? 3.375 g ?12.5 mL/hr over 240 Minutes Intravenous Every 8 hours 10/25/21 1341 10/28/21 0911  ? 10/25/21 0830  piperacillin-tazobactam (ZOSYN) IVPB 3.375 g       ? 3.375 g ?100 mL/hr over 30 Minutes Intravenous  Once 10/25/21 0815 10/25/21 0924  ? ?  ? ? ? ?Assessment/Plan ?Perforated appendicitis ?POD#5 s/p laparoscopic appendectomy 4/9 Dr. Thermon Leyland  ?- tolerating soft diet and having bowel function, flatus and loose stools ?- JP cloudy/SS, continue to monitor output, likely plan to discharge home with drain ?- continue IV antibiotics ?- low grade temp over night, labs are pending. Check CXR. If WBC remains elevated will plan for CT scan ?  ?FEN: soft, Boost, IVF@50  ml/hr ?ID: zosyn 4/9>> ?VTE: lovenox ? ?  Obesity BMI 38.76 ? ? LOS: 5 days  ? ? ?Wellington Hampshire, PA-C ?[facility] ?10/30/2021, 8:38 AM ?Please see Amion for pager number during day hours 7:00am-4:30pm ? ?

## 2021-10-31 LAB — CBC
HCT: 40 % (ref 39.0–52.0)
Hemoglobin: 14.4 g/dL (ref 13.0–17.0)
MCH: 29 pg (ref 26.0–34.0)
MCHC: 36 g/dL (ref 30.0–36.0)
MCV: 80.6 fL (ref 80.0–100.0)
Platelets: 455 10*3/uL — ABNORMAL HIGH (ref 150–400)
RBC: 4.96 MIL/uL (ref 4.22–5.81)
RDW: 13.6 % (ref 11.5–15.5)
WBC: 30.2 10*3/uL — ABNORMAL HIGH (ref 4.0–10.5)
nRBC: 0 % (ref 0.0–0.2)

## 2021-10-31 LAB — BASIC METABOLIC PANEL
Anion gap: 9 (ref 5–15)
BUN: <5 mg/dL — ABNORMAL LOW (ref 6–20)
CO2: 26 mmol/L (ref 22–32)
Calcium: 8.8 mg/dL — ABNORMAL LOW (ref 8.9–10.3)
Chloride: 102 mmol/L (ref 98–111)
Creatinine, Ser: 1.14 mg/dL (ref 0.61–1.24)
GFR, Estimated: >60 mL/min (ref >60)
Glucose, Bld: 91 mg/dL (ref 70–99)
Potassium: 3.4 mmol/L — ABNORMAL LOW (ref 3.5–5.1)
Sodium: 137 mmol/L (ref 135–145)

## 2021-10-31 LAB — MAGNESIUM: Magnesium: 1.9 mg/dL (ref 1.7–2.4)

## 2021-10-31 MED ORDER — POTASSIUM CHLORIDE 20 MEQ PO PACK
40.0000 meq | PACK | Freq: Two times a day (BID) | ORAL | Status: DC
  Administered 2021-10-31 – 2021-11-03 (×5): 40 meq via ORAL
  Filled 2021-10-31 (×5): qty 2

## 2021-10-31 MED ORDER — ENOXAPARIN SODIUM 60 MG/0.6ML IJ SOSY
55.0000 mg | PREFILLED_SYRINGE | INTRAMUSCULAR | Status: DC
  Filled 2021-10-31: qty 0.6

## 2021-10-31 NOTE — Consult Note (Signed)
? ?Chief Complaint: ?Patient was seen in consultation today for pelvic collection aspiration vs drain placement ?Chief Complaint  ?Patient presents with  ? Emesis  ? at the request of Dr Dossie Der ? ?Supervising Physician: Ruel Favors ? ?Patient Status: [facility] - In-pt ? ?History of Present Illness: ?Brad Lyons is a 26 y.o. male  ? ?Lap appendectomy 4/9 ?Increased Abd pain and leukocytosis ? ?CT yesterday:  IMPRESSION: ?1. Status post appendectomy with surgical drain in place and ?multiple focal fluid collections in the pelvis, concerning for ?possible developing abscesses and/or peritonitis. ?2. Small amount of free fluid in the perihepatic space. ?3. Small right pleural effusion with atelectasis at the lung bases. ?4. Mild gallbladder wall thickening, likely related to localized ?inflammatory changes. ? ?Request made for drain placement ?Dr Miles Costain has reviewed imaging and approves attempt at aspiration vs drain placement ? ? ?Past Medical History:  ?Diagnosis Date  ? GERD (gastroesophageal reflux disease)   ? ? ?Past Surgical History:  ?Procedure Laterality Date  ? LAPAROSCOPIC APPENDECTOMY  10/25/2021  ? Procedure: APPENDECTOMY LAPAROSCOPIC;  Surgeon: Quentin Ore, MD;  Location: [facility] OR;  Service: General;;  ? LAPAROSCOPY N/A 10/25/2021  ? Procedure: LAPAROSCOPY DIAGNOSTIC;  Surgeon: Quentin Ore, MD;  Location: [facility] OR;  Service: General;  Laterality: N/A;  ? ? ?Allergies: ?Patient has no known allergies. ? ?Medications: ?Prior to Admission medications   ?Medication Sig Start Date End Date Taking? Authorizing Provider  ?acetaminophen (TYLENOL) 500 MG tablet Take 500-1,000 mg by mouth every 6 (six) hours as needed for mild pain or headache (Tooth ache).   Yes [provider]  ?bismuth subsalicylate (PEPTO BISMOL) 262 MG chewable tablet Chew 262-524 mg by mouth as needed for indigestion.   Yes [provider]  ?ondansetron (ZOFRAN) 4 MG tablet Take 1 tablet (4 mg total) by mouth  every 8 (eight) hours as needed for nausea or vomiting. 10/21/21  Yes Meccariello, Solmon Ice, DO  ?polyethylene glycol powder (GLYCOLAX/MIRALAX) 17 GM/SCOOP powder Take 17 g by mouth daily as needed for mild constipation.   Yes [provider]  ?  ? ?Family History  ?Problem Relation Age of Onset  ? Healthy Father   ? ? ?Social History  ? ?Socioeconomic History  ? Marital status: Single  ?  Spouse name: Not on file  ? Number of children: Not on file  ? Years of education: Not on file  ? Highest education level: Not on file  ?Occupational History  ? Not on file  ?Tobacco Use  ? Smoking status: Every Day  ?  Types: Cigarettes, Cigars  ? Smokeless tobacco: Never  ?Substance and Sexual Activity  ? Alcohol use: Not on file  ? Drug use: Yes  ?  Types: Marijuana  ? Sexual activity: Not on file  ?Other Topics Concern  ? Not on file  ?Social History Narrative  ? Not on file  ? ?Social Determinants of Health  ? ?Financial Resource Strain: Not on file  ?Food Insecurity: Not on file  ?Transportation Needs: Not on file  ?Physical Activity: Not on file  ?Stress: Not on file  ?Social Connections: Not on file  ? ? ?Review of Systems: A 12 point ROS discussed and pertinent positives are indicated in the HPI above.  All other systems are negative. ? ?Vital Signs: ?BP 127/85 (BP Location: Left Arm)   Pulse 88   Temp 98.2 ?F (36.8 ?C) (Oral)   Resp 16   Ht 5' 5.98" (1.676 m)   Wt  240 lb 0.2 oz (108.9 kg)   SpO2 99%   BMI 38.76 kg/m?  ? ?Physical Exam ?Vitals reviewed.  ?HENT:  ?   Mouth/Throat:  ?   Mouth: Mucous membranes are moist.  ?Cardiovascular:  ?   Rate and Rhythm: Normal rate and regular rhythm.  ?   Heart sounds: Normal heart sounds.  ?Pulmonary:  ?   Effort: Pulmonary effort is normal.  ?   Breath sounds: Normal breath sounds.  ?Abdominal:  ?   Palpations: Abdomen is soft.  ?   Tenderness: There is abdominal tenderness.  ?Musculoskeletal:     ?   General: Normal range of motion.  ?Skin: ?   General: Skin is  warm.  ?Neurological:  ?   Mental Status: He is alert and oriented to person, place, and time.  ?Psychiatric:     ?   Behavior: Behavior normal.  ? ? ?Imaging: ?DG Abd 1 View ? ?Result Date: 10/21/2021 ?CLINICAL DATA:  Abdominal pain and vomiting. EXAM: ABDOMEN - 1 VIEW COMPARISON:  None. FINDINGS: Stool is seen in the cecum and ascending colon. Relative paucity of bowel gas elsewhere. IMPRESSION: Stool in the cecum and ascending colon may reflect constipation. Electronically Signed   By: Leanna Battles M.D.   On: 10/21/2021 11:15  ? ?CT ABDOMEN PELVIS W CONTRAST ? ?Result Date: 10/30/2021 ?CLINICAL DATA:  Postop abdominal pain. EXAM: CT ABDOMEN AND PELVIS WITH CONTRAST TECHNIQUE: Multidetector CT imaging of the abdomen and pelvis was performed using the standard protocol following bolus administration of intravenous contrast. RADIATION DOSE REDUCTION: This exam was performed according to the departmental dose-optimization program which includes automated exposure control, adjustment of the mA and/or kV according to patient size and/or use of iterative reconstruction technique. CONTRAST:  OMNIPAQUE IOHEXOL 300 MG/ML  SOLN COMPARISON:  10/25/2021. FINDINGS: Lower chest: There is a small right pleural effusion with atelectasis at the lung bases. The heart is normal in size. Nonspecific prominent lymph nodes are present in the epicardial fat pad on the right. Hepatobiliary: No focal liver abnormality is seen. No biliary ductal dilatation. There is mild gallbladder wall thickening with no evidence of cholelithiasis. Pancreas: Unremarkable. No pancreatic ductal dilatation or surrounding inflammatory changes. Spleen: Normal in size without focal abnormality. Adrenals/Urinary Tract: Adrenal glands are unremarkable. Kidneys are normal, without renal calculi, focal lesion, or hydronephrosis. Bladder is unremarkable. Stomach/Bowel: The stomach is within normal limits. The patient is status post presumed appendectomy with  surgical changes at the cecum. A surgical drain enters the abdomen in the left lower quadrant and terminates in the mid right abdomen. The previously described collection in the right lower quadrant is decreased in size now measuring 2.6 cm. Multiple additional fluid collections are noted in the pelvis: Right pericolic gutter measuring 2.0 x 1.3 cm, axial image 45, right lower quadrant measuring 3.2 x 1.8 cm, axial image 54, in the rectovesical pouch measuring 5.7 x 4.9 cm with thickening of the peritoneal wall, axial image 77, and in the pelvis anteriorly in the midline measuring 2.3 x 6.2 cm, axial image 78. Vascular/Lymphatic: No significant vascular findings. Prominent lymph nodes are present in the retroperitoneum and mesenteric root which are likely reactive. Reproductive: Prostate is unremarkable. Other: Small amount of perihepatic free fluid. Musculoskeletal: Fixation hardware is present in the proximal right femur. No acute osseous abnormality. IMPRESSION: 1. Status post appendectomy with surgical drain in place and multiple focal fluid collections in the pelvis, concerning for possible developing abscesses and/or peritonitis. 2. Small amount  of free fluid in the perihepatic space. 3. Small right pleural effusion with atelectasis at the lung bases. 4. Mild gallbladder wall thickening, likely related to localized inflammatory changes. Electronically Signed   By: Thornell SartoriusLaura  Taylor M.D.   On: 10/30/2021 20:50  ? ?CT ABDOMEN PELVIS W CONTRAST ? ?Result Date: 10/25/2021 ?CLINICAL DATA:  Acute, nonlocalized abdominal pain EXAM: CT ABDOMEN AND PELVIS WITH CONTRAST TECHNIQUE: Multidetector CT imaging of the abdomen and pelvis was performed using the standard protocol following bolus administration of intravenous contrast. RADIATION DOSE REDUCTION: This exam was performed according to the departmental dose-optimization program which includes automated exposure control, adjustment of the mA and/or kV according to patient  size and/or use of iterative reconstruction technique. CONTRAST:  100mL OMNIPAQUE IOHEXOL 300 MG/ML  SOLN COMPARISON:  None. FINDINGS: Lower chest:  No contributory findings. Hepatobiliary: No focal liver abnormalit

## 2021-10-31 NOTE — Progress Notes (Signed)
[facility] ?Progress Note ? ?6 Days Post-Op  ?Subjective: ?CC-  ?Having some right upper abdominal pain worse with deep inspiration and pain around drain. Pain is overall stable. No nausea/emesis and continued loose stools though he thinks this is lessening. Denies SHOB this am. Has not had anything but sips of water this am. Drain became murky white yesterday mid day ? ?Objective: ?Vital signs in last 24 hours: ?Temp:  [98.2 ?F (36.8 ?C)-98.7 ?F (37.1 ?C)] 98.2 ?F (36.8 ?C) (04/15 0750) ?Pulse Rate:  [88-99] 88 (04/15 0750) ?Resp:  [16-18] 16 (04/15 0750) ?BP: (127-133)/(77-92) 127/85 (04/15 0750) ?SpO2:  [96 %-99 %] 99 % (04/15 0750) ?Last BM Date : 10/30/21 ? ?Intake/Output from previous day: ?04/14 0701 - 04/15 0700 ?In: 1955.8 [P.O.:600; I.V.:893.4; IV Piggyback:462.4] ?Out: 700 [Urine:700] ?Intake/Output this shift: ?No intake/output data recorded. ? ?PE: ?Gen:  Alert, NAD ?Cardio: mild tachy, low 100s ?Pulm: rate and effort normal ?Abd: soft, mild distension. Mild TTP around drain. Incisions cdi. Drain milky white.  ?Msk: calves soft and nontender without edema ? ?Lab Results:  ?Recent Labs  ?  10/29/21 ?9379 10/30/21 ?1008  ?WBC 29.2* 26.6*  ?HGB 14.3 13.5  ?HCT 39.5 37.9*  ?PLT 366 401*  ? ? ?BMET ?Recent Labs  ?  10/29/21 ?0240 10/30/21 ?1008  ?NA 137 135  ?K 3.3* 3.0*  ?CL 102 102  ?CO2 26 25  ?GLUCOSE 106* 110*  ?BUN <5* <5*  ?CREATININE 1.00 0.97  ?CALCIUM 8.4* 8.4*  ? ? ?PT/INR ?No results for input(s): LABPROT, INR in the last 72 hours. ?CMP  ?   ?Component Value Date/Time  ? NA 135 10/30/2021 1008  ? K 3.0 (L) 10/30/2021 1008  ? CL 102 10/30/2021 1008  ? CO2 25 10/30/2021 1008  ? GLUCOSE 110 (H) 10/30/2021 1008  ? BUN <5 (L) 10/30/2021 1008  ? CREATININE 0.97 10/30/2021 1008  ? CALCIUM 8.4 (L) 10/30/2021 1008  ? PROT 8.6 (H) 10/25/2021 9735  ? ALBUMIN 3.7 10/25/2021 0628  ? AST 21 10/25/2021 0628  ? ALT 26 10/25/2021 0628  ? ALKPHOS 96 10/25/2021 0628  ? BILITOT 1.9 (H) 10/25/2021 3299   ? GFRNONAA >60 10/30/2021 1008  ? ?Lipase  ?   ?Component Value Date/Time  ? LIPASE 17 10/25/2021 0628  ? ? ? ? ? ?Studies/Results: ?CT ABDOMEN PELVIS W CONTRAST ? ?Result Date: 10/30/2021 ?CLINICAL DATA:  Postop abdominal pain. EXAM: CT ABDOMEN AND PELVIS WITH CONTRAST TECHNIQUE: Multidetector CT imaging of the abdomen and pelvis was performed using the standard protocol following bolus administration of intravenous contrast. RADIATION DOSE REDUCTION: This exam was performed according to the departmental dose-optimization program which includes automated exposure control, adjustment of the mA and/or kV according to patient size and/or use of iterative reconstruction technique. CONTRAST:  OMNIPAQUE IOHEXOL 300 MG/ML  SOLN COMPARISON:  10/25/2021. FINDINGS: Lower chest: There is a small right pleural effusion with atelectasis at the lung bases. The heart is normal in size. Nonspecific prominent lymph nodes are present in the epicardial fat pad on the right. Hepatobiliary: No focal liver abnormality is seen. No biliary ductal dilatation. There is mild gallbladder wall thickening with no evidence of cholelithiasis. Pancreas: Unremarkable. No pancreatic ductal dilatation or surrounding inflammatory changes. Spleen: Normal in size without focal abnormality. Adrenals/Urinary Tract: Adrenal glands are unremarkable. Kidneys are normal, without renal calculi, focal lesion, or hydronephrosis. Bladder is unremarkable. Stomach/Bowel: The stomach is within normal limits. The patient is status post presumed appendectomy with surgical changes at  the cecum. A surgical drain enters the abdomen in the left lower quadrant and terminates in the mid right abdomen. The previously described collection in the right lower quadrant is decreased in size now measuring 2.6 cm. Multiple additional fluid collections are noted in the pelvis: Right pericolic gutter measuring 2.0 x 1.3 cm, axial image 45, right lower quadrant measuring 3.2 x  1.8 cm, axial image 54, in the rectovesical pouch measuring 5.7 x 4.9 cm with thickening of the peritoneal wall, axial image 77, and in the pelvis anteriorly in the midline measuring 2.3 x 6.2 cm, axial image 78. Vascular/Lymphatic: No significant vascular findings. Prominent lymph nodes are present in the retroperitoneum and mesenteric root which are likely reactive. Reproductive: Prostate is unremarkable. Other: Small amount of perihepatic free fluid. Musculoskeletal: Fixation hardware is present in the proximal right femur. No acute osseous abnormality. IMPRESSION: 1. Status post appendectomy with surgical drain in place and multiple focal fluid collections in the pelvis, concerning for possible developing abscesses and/or peritonitis. 2. Small amount of free fluid in the perihepatic space. 3. Small right pleural effusion with atelectasis at the lung bases. 4. Mild gallbladder wall thickening, likely related to localized inflammatory changes. Electronically Signed   By: Thornell Sartorius M.D.   On: 10/30/2021 20:50  ? ?DG CHEST PORT 1 VIEW ? ?Result Date: 10/30/2021 ?CLINICAL DATA:  Cough. EXAM: PORTABLE CHEST 1 VIEW COMPARISON:  None. FINDINGS: The heart size and mediastinal contours are within normal limits. Both lungs are clear. The visualized skeletal structures are unremarkable. IMPRESSION: No active disease. Electronically Signed   By: Lupita Raider M.D.   On: 10/30/2021 09:11   ? ?Anti-infectives: ?Anti-infectives (From admission, onward)  ? ? Start     Dose/Rate Route Frequency Ordered Stop  ? 10/28/21 1300  piperacillin-tazobactam (ZOSYN) IVPB 3.375 g       ? 3.375 g ?12.5 mL/hr over 240 Minutes Intravenous Every 8 hours 10/28/21 1022    ? 10/25/21 1500  piperacillin-tazobactam (ZOSYN) IVPB 3.375 g       ? 3.375 g ?12.5 mL/hr over 240 Minutes Intravenous Every 8 hours 10/25/21 1341 10/28/21 0911  ? 10/25/21 0830  piperacillin-tazobactam (ZOSYN) IVPB 3.375 g       ? 3.375 g ?100 mL/hr over 30 Minutes  Intravenous  Once 10/25/21 0815 10/25/21 0924  ? ?  ? ? ? ?Assessment/Plan ?Perforated appendicitis ?POD#6 s/p laparoscopic appendectomy 4/9 Dr. Dossie Der  ?- tolerating soft diet and having bowel function, flatus and loose stools ?- JP now purulent, continue to monitor output, plan to discharge home with drain ?- continue IV antibiotics ?- CT 4/14 with multiple focal fluid collections in pelvis concerning for developing abscesses and/or peritonitis. Small amount of free fluid in the perihepatic space. small right pleural effusion with atelectasis at the lung bases. ?- CBC pending. Drain now purulent and may be decompressing abscesses through it. Will review with MD if further drainage necessary. NPO for now in case further drainage but had been tolerating soft diet and can return to this if no intervention ?- Pleural effusion/cough - monitor. encouraged IS ?  ?FEN: NPO, IVF@50  ml/hr ?ID: zosyn 4/9>> ?VTE: lovenox ? ?Obesity BMI 38.76 ? ? LOS: 6 days  ? ? ?Eric Form, PA-C ?[facility] ?10/31/2021, 9:08 AM ?Please see Amion for pager number during day hours 7:00am-4:30pm ? ?

## 2021-11-01 ENCOUNTER — Inpatient Hospital Stay (HOSPITAL_COMMUNITY): Payer: Self-pay

## 2021-11-01 LAB — CBC
HCT: 37.2 % — ABNORMAL LOW (ref 39.0–52.0)
Hemoglobin: 13.1 g/dL (ref 13.0–17.0)
MCH: 28.4 pg (ref 26.0–34.0)
MCHC: 35.2 g/dL (ref 30.0–36.0)
MCV: 80.5 fL (ref 80.0–100.0)
Platelets: 476 10*3/uL — ABNORMAL HIGH (ref 150–400)
RBC: 4.62 MIL/uL (ref 4.22–5.81)
RDW: 13.4 % (ref 11.5–15.5)
WBC: 25 10*3/uL — ABNORMAL HIGH (ref 4.0–10.5)
nRBC: 0 % (ref 0.0–0.2)

## 2021-11-01 LAB — BASIC METABOLIC PANEL
Anion gap: 10 (ref 5–15)
BUN: <5 mg/dL — ABNORMAL LOW (ref 6–20)
CO2: 24 mmol/L (ref 22–32)
Calcium: 8.4 mg/dL — ABNORMAL LOW (ref 8.9–10.3)
Chloride: 99 mmol/L (ref 98–111)
Creatinine, Ser: 1.09 mg/dL (ref 0.61–1.24)
GFR, Estimated: >60 mL/min (ref >60)
Glucose, Bld: 104 mg/dL — ABNORMAL HIGH (ref 70–99)
Potassium: 3.4 mmol/L — ABNORMAL LOW (ref 3.5–5.1)
Sodium: 133 mmol/L — ABNORMAL LOW (ref 135–145)

## 2021-11-01 LAB — PROTIME-INR
INR: 1.1 (ref 0.8–1.2)
Prothrombin Time: 14.2 seconds (ref 11.4–15.2)

## 2021-11-01 MED ORDER — OXYCODONE HCL 5 MG PO TABS
5.0000 mg | ORAL_TABLET | ORAL | Status: DC | PRN
  Administered 2021-11-01 – 2021-11-02 (×4): 10 mg via ORAL
  Filled 2021-11-01 (×5): qty 2

## 2021-11-01 MED ORDER — MIDAZOLAM HCL 2 MG/2ML IJ SOLN
INTRAMUSCULAR | Status: AC
Start: 2021-11-01 — End: 2021-11-01
  Filled 2021-11-01: qty 6

## 2021-11-01 MED ORDER — FENTANYL CITRATE (PF) 100 MCG/2ML IJ SOLN
INTRAMUSCULAR | Status: AC
  Filled 2021-11-01: qty 8

## 2021-11-01 MED ORDER — LIDOCAINE HCL 1 % IJ SOLN
INTRAMUSCULAR | Status: AC
  Filled 2021-11-01: qty 10

## 2021-11-01 MED ORDER — SODIUM CHLORIDE 0.9% FLUSH
5.0000 mL | Freq: Three times a day (TID) | INTRAVENOUS | Status: DC
  Administered 2021-11-01 – 2021-11-02 (×2): 5 mL

## 2021-11-01 MED ORDER — FENTANYL CITRATE (PF) 100 MCG/2ML IJ SOLN
INTRAMUSCULAR | Status: AC | PRN
Start: 2021-11-01 — End: 2021-11-01
  Administered 2021-11-01 (×4): 50 ug via INTRAVENOUS

## 2021-11-01 MED ORDER — MIDAZOLAM HCL 2 MG/2ML IJ SOLN
INTRAMUSCULAR | Status: AC | PRN
  Administered 2021-11-01 (×2): 1 mg via INTRAVENOUS
  Administered 2021-11-01: 2 mg via INTRAVENOUS

## 2021-11-01 NOTE — Progress Notes (Signed)
[facility] ?Progress Note ? ?7 Days Post-Op  ?Subjective: ?Just arrived back to floor from IR. Is still drowsy and having pain at drain insertion site. Had around 6 loose bowel movements yesterday. No abdominal pain currently. No respiratory complaints - has been ambulating more and using IS ? ?Objective: ?Vital signs in last 24 hours: ?Temp:  [98.1 ?F (36.7 ?C)-99.5 ?F (37.5 ?C)] 98.1 ?F (36.7 ?C) (04/16 0511) ?Pulse Rate:  [96-99] 99 (04/16 0511) ?Resp:  [16-18] 18 (04/16 0511) ?BP: (124-127)/(75-84) 127/84 (04/16 0511) ?SpO2:  [96 %-98 %] 96 % (04/16 0511) ?Last BM Date : 10/30/21 ? ?Intake/Output from previous day: ?No intake/output data recorded. ?Intake/Output this shift: ?No intake/output data recorded. ? ?PE: ?Gen:  Alert, NAD ?Cardio: regular rate ?Pulm: rate and effort normal on room air ?Abd: soft, mild distension. Mild TTP around drain. Incisions cdi. JP drain is purulent. IR transgluteal drain with cloudy SS fluid  ?Msk: calves soft and nontender without edema ? ?Lab Results:  ?Recent Labs  ?  10/30/21 ?1008 10/31/21 ?0916  ?WBC 26.6* 30.2*  ?HGB 13.5 14.4  ?HCT 37.9* 40.0  ?PLT 401* 455*  ? ? ?BMET ?Recent Labs  ?  10/30/21 ?1008 10/31/21 ?0916  ?NA 135 137  ?K 3.0* 3.4*  ?CL 102 102  ?CO2 25 26  ?GLUCOSE 110* 91  ?BUN <5* <5*  ?CREATININE 0.97 1.14  ?CALCIUM 8.4* 8.8*  ? ? ?PT/INR ?No results for input(s): LABPROT, INR in the last 72 hours. ?CMP  ?   ?Component Value Date/Time  ? NA 137 10/31/2021 0916  ? K 3.4 (L) 10/31/2021 0916  ? CL 102 10/31/2021 0916  ? CO2 26 10/31/2021 0916  ? GLUCOSE 91 10/31/2021 0916  ? BUN <5 (L) 10/31/2021 0916  ? CREATININE 1.14 10/31/2021 0916  ? CALCIUM 8.8 (L) 10/31/2021 0916  ? PROT 8.6 (H) 10/25/2021 6333  ? ALBUMIN 3.7 10/25/2021 0628  ? AST 21 10/25/2021 0628  ? ALT 26 10/25/2021 0628  ? ALKPHOS 96 10/25/2021 0628  ? BILITOT 1.9 (H) 10/25/2021 5456  ? GFRNONAA >60 10/31/2021 0916  ? ?Lipase  ?   ?Component Value Date/Time  ? LIPASE 17 10/25/2021 0628   ? ? ? ? ? ?Studies/Results: ?CT ABDOMEN PELVIS W CONTRAST ? ?Result Date: 10/30/2021 ?CLINICAL DATA:  Postop abdominal pain. EXAM: CT ABDOMEN AND PELVIS WITH CONTRAST TECHNIQUE: Multidetector CT imaging of the abdomen and pelvis was performed using the standard protocol following bolus administration of intravenous contrast. RADIATION DOSE REDUCTION: This exam was performed according to the departmental dose-optimization program which includes automated exposure control, adjustment of the mA and/or kV according to patient size and/or use of iterative reconstruction technique. CONTRAST:  OMNIPAQUE IOHEXOL 300 MG/ML  SOLN COMPARISON:  10/25/2021. FINDINGS: Lower chest: There is a small right pleural effusion with atelectasis at the lung bases. The heart is normal in size. Nonspecific prominent lymph nodes are present in the epicardial fat pad on the right. Hepatobiliary: No focal liver abnormality is seen. No biliary ductal dilatation. There is mild gallbladder wall thickening with no evidence of cholelithiasis. Pancreas: Unremarkable. No pancreatic ductal dilatation or surrounding inflammatory changes. Spleen: Normal in size without focal abnormality. Adrenals/Urinary Tract: Adrenal glands are unremarkable. Kidneys are normal, without renal calculi, focal lesion, or hydronephrosis. Bladder is unremarkable. Stomach/Bowel: The stomach is within normal limits. The patient is status post presumed appendectomy with surgical changes at the cecum. A surgical drain enters the abdomen in the left lower quadrant and terminates in  the mid right abdomen. The previously described collection in the right lower quadrant is decreased in size now measuring 2.6 cm. Multiple additional fluid collections are noted in the pelvis: Right pericolic gutter measuring 2.0 x 1.3 cm, axial image 45, right lower quadrant measuring 3.2 x 1.8 cm, axial image 54, in the rectovesical pouch measuring 5.7 x 4.9 cm with thickening of the  peritoneal wall, axial image 77, and in the pelvis anteriorly in the midline measuring 2.3 x 6.2 cm, axial image 78. Vascular/Lymphatic: No significant vascular findings. Prominent lymph nodes are present in the retroperitoneum and mesenteric root which are likely reactive. Reproductive: Prostate is unremarkable. Other: Small amount of perihepatic free fluid. Musculoskeletal: Fixation hardware is present in the proximal right femur. No acute osseous abnormality. IMPRESSION: 1. Status post appendectomy with surgical drain in place and multiple focal fluid collections in the pelvis, concerning for possible developing abscesses and/or peritonitis. 2. Small amount of free fluid in the perihepatic space. 3. Small right pleural effusion with atelectasis at the lung bases. 4. Mild gallbladder wall thickening, likely related to localized inflammatory changes. Electronically Signed   By: Thornell Sartorius M.D.   On: 10/30/2021 20:50   ? ?Anti-infectives: ?Anti-infectives (From admission, onward)  ? ? Start     Dose/Rate Route Frequency Ordered Stop  ? 10/28/21 1300  piperacillin-tazobactam (ZOSYN) IVPB 3.375 g       ? 3.375 g ?12.5 mL/hr over 240 Minutes Intravenous Every 8 hours 10/28/21 1022    ? 10/25/21 1500  piperacillin-tazobactam (ZOSYN) IVPB 3.375 g       ? 3.375 g ?12.5 mL/hr over 240 Minutes Intravenous Every 8 hours 10/25/21 1341 10/28/21 0911  ? 10/25/21 0830  piperacillin-tazobactam (ZOSYN) IVPB 3.375 g       ? 3.375 g ?100 mL/hr over 30 Minutes Intravenous  Once 10/25/21 0815 10/25/21 0924  ? ?  ? ? ? ?Assessment/Plan ?Perforated appendicitis ?POD#7 s/p laparoscopic appendectomy 4/9 Dr. Dossie Der  ?- tolerating soft diet and having bowel function, flatus and loose stools ?- JP drain purulent, continue to monitor output, plan to discharge home with drain ?- continue IV antibiotics ?- CT 4/14 with multiple focal fluid collections in pelvis concerning for developing abscesses and/or peritonitis. Small amount of  free fluid in the perihepatic space. small right pleural effusion with atelectasis at the lung bases. ?- s/p IR transgluteal drain placement 4/16. Monitor culture ?- CBC pending.  ?- Pleural effusion/cough - monitor. encouraged IS ?  ?FEN: soft, IVF(dextrose+K)@50  ml/hr, K 40 mg bid ?ID: zosyn 4/9>> ?VTE: lovenox ? ?Obesity BMI 38.76 ? ? LOS: 7 days  ? ? ?Eric Form, PA-C ?[facility] ?11/01/2021, 10:15 AM ?Please see Amion for pager number during day hours 7:00am-4:30pm ? ?

## 2021-11-01 NOTE — Progress Notes (Signed)
Patient return to 6 north room 15 from getting drain placement. Patient is alert and oriented x4. Both drains are to suction. Will continue to monitor. Bed in lowest position. Call light in reach ?

## 2021-11-01 NOTE — Procedures (Signed)
Interventional Radiology Procedure Note ? ?Procedure: CT RT TRANSGLUTEAL PELVIC ABSCESS DRAIN   ? ?Complications: None ? ?Estimated Blood Loss:  MIN ? ?Findings: ?60CC PUS ?CX SENT   ? ?M. Ruel Favors, MD ? ? ? ?

## 2021-11-02 LAB — BASIC METABOLIC PANEL
Anion gap: 10 (ref 5–15)
BUN: <5 mg/dL — ABNORMAL LOW (ref 6–20)
CO2: 28 mmol/L (ref 22–32)
Calcium: 8.8 mg/dL — ABNORMAL LOW (ref 8.9–10.3)
Chloride: 98 mmol/L (ref 98–111)
Creatinine, Ser: 1.26 mg/dL — ABNORMAL HIGH (ref 0.61–1.24)
GFR, Estimated: >60 mL/min (ref >60)
Glucose, Bld: 93 mg/dL (ref 70–99)
Potassium: 3.7 mmol/L (ref 3.5–5.1)
Sodium: 136 mmol/L (ref 135–145)

## 2021-11-02 LAB — CBC
HCT: 38.5 % — ABNORMAL LOW (ref 39.0–52.0)
Hemoglobin: 13.4 g/dL (ref 13.0–17.0)
MCH: 28.4 pg (ref 26.0–34.0)
MCHC: 34.8 g/dL (ref 30.0–36.0)
MCV: 81.6 fL (ref 80.0–100.0)
Platelets: 473 10*3/uL — ABNORMAL HIGH (ref 150–400)
RBC: 4.72 MIL/uL (ref 4.22–5.81)
RDW: 13.4 % (ref 11.5–15.5)
WBC: 24 10*3/uL — ABNORMAL HIGH (ref 4.0–10.5)
nRBC: 0 % (ref 0.0–0.2)

## 2021-11-02 MED ORDER — TRAMADOL HCL 50 MG PO TABS
50.0000 mg | ORAL_TABLET | Freq: Four times a day (QID) | ORAL | Status: DC | PRN
  Administered 2021-11-02: 100 mg via ORAL
  Filled 2021-11-02: qty 2

## 2021-11-02 MED ORDER — HYDROMORPHONE HCL 1 MG/ML IJ SOLN
2.0000 mg | INTRAMUSCULAR | Status: DC | PRN
  Administered 2021-11-02 – 2021-11-03 (×3): 2 mg via INTRAVENOUS
  Filled 2021-11-02 (×3): qty 2

## 2021-11-02 MED ORDER — METHOCARBAMOL 500 MG PO TABS
500.0000 mg | ORAL_TABLET | Freq: Three times a day (TID) | ORAL | Status: DC | PRN
Start: 2021-11-02 — End: 2021-11-03
  Administered 2021-11-02: 500 mg via ORAL
  Filled 2021-11-02: qty 1

## 2021-11-02 NOTE — Progress Notes (Signed)
[facility] ?Progress Note ? ?8 Days Post-Op  ?Subjective: ?States he is doing really well this am. Pain is well controlled and no more RUQ/low chest pain. No respiratory complaints. Bms have slowed down - thinks he had just one yesterday and feels like he needs to have one this am. Tolerating soft diet ? ?Objective: ?Vital signs in last 24 hours: ?Temp:  [98.4 ?F (36.9 ?C)-98.7 ?F (37.1 ?C)] 98.4 ?F (36.9 ?C) (04/16 1954) ?Pulse Rate:  [96-114] 96 (04/16 1954) ?Resp:  [18-30] 18 (04/16 1954) ?BP: (113-147)/(77-97) 124/83 (04/16 1954) ?SpO2:  [95 %-99 %] 95 % (04/16 1954) ?Last BM Date : 10/30/21 ? ?Intake/Output from previous day: ?04/16 0701 - 04/17 0700 ?In: -  ?Out: 180 [Drains:180] ?Intake/Output this shift: ?No intake/output data recorded. ? ?PE: ?Gen:  Alert, NAD ?Cardio: regular rate ?Pulm: rate and effort normal on room air ?Abd: soft, no distension. Mild TTP around LLQ drain which is purulent. Incisions cdi. IR transgluteal drain with cloudy SS fluid  ?Msk: calves soft and nontender without edema ? ?Lab Results:  ?Recent Labs  ?  10/31/21 ?0916 11/01/21 ?1554  ?WBC 30.2* 25.0*  ?HGB 14.4 13.1  ?HCT 40.0 37.2*  ?PLT 455* 476*  ? ? ?BMET ?Recent Labs  ?  10/31/21 ?0916 11/01/21 ?1554  ?NA 137 133*  ?K 3.4* 3.4*  ?CL 102 99  ?CO2 26 24  ?GLUCOSE 91 104*  ?BUN <5* <5*  ?CREATININE 1.14 1.09  ?CALCIUM 8.8* 8.4*  ? ? ?PT/INR ?Recent Labs  ?  11/01/21 ?1554  ?LABPROT 14.2  ?INR 1.1  ? ?CMP  ?   ?Component Value Date/Time  ? NA 133 (L) 11/01/2021 1554  ? K 3.4 (L) 11/01/2021 1554  ? CL 99 11/01/2021 1554  ? CO2 24 11/01/2021 1554  ? GLUCOSE 104 (H) 11/01/2021 1554  ? BUN <5 (L) 11/01/2021 1554  ? CREATININE 1.09 11/01/2021 1554  ? CALCIUM 8.4 (L) 11/01/2021 1554  ? PROT 8.6 (H) 10/25/2021 [id number]  ? ALBUMIN 3.7 10/25/2021 0628  ? AST 21 10/25/2021 0628  ? ALT 26 10/25/2021 0628  ? ALKPHOS 96 10/25/2021 0628  ? BILITOT 1.9 (H) 10/25/2021 [id number]  ? GFRNONAA >60 11/01/2021 1554  ? ?Lipase  ?   ?Component  Value Date/Time  ? LIPASE 17 10/25/2021 0628  ? ? ? ? ? ?Studies/Results: ?CT IMAGE GUIDED DRAINAGE BY PERCUTANEOUS CATHETER ? ?Result Date: 11/01/2021 ?INDICATION: Posterior pelvic cul-de-sac abscess, perforated appendicitis EXAM: RIGHT TRANS GLUTEAL CT DRAINAGE OF THE PELVIC ABSCESS MEDICATIONS: The patient is currently admitted to the hospital and receiving intravenous antibiotics. The antibiotics were administered within an appropriate time frame prior to the initiation of the procedure. ANESTHESIA/SEDATION: Moderate (conscious) sedation was employed during this procedure. A total of Versed 4.0 mg and Fentanyl 100 mcg was administered intravenously by the radiology nurse. Total intra-service moderate Sedation Time: 25 MINUTE minutes. The patient's level of consciousness and vital signs were monitored continuously by radiology nursing throughout the procedure under my direct supervision. COMPLICATIONS: None immediate. PROCEDURE: Informed written consent was obtained from the patient after a thorough discussion of the procedural risks, benefits and alternatives. All questions were addressed. Maximal Sterile Barrier Technique was utilized including caps, mask, sterile gowns, sterile gloves, sterile drape, hand hygiene and skin antiseptic. A timeout was performed prior to the initiation of the procedure. Previous imaging reviewed. Patient positioned nearly prone and noncontrast localization CT performed. The posterior pelvic cul-de-sac fluid collection was localized and marked for a right trans gluteal approach.  Under sterile conditions and local anesthesia, an 18 gauge 15 cm access needle was advanced from a right transgluteal approach into the fluid collection. Needle position confirmed with CT. Syringe aspiration yielded purulent fluid. Sample sent for culture. Guidewire inserted followed by tract dilatation insert a 10 French drain. Drain catheter position confirmed with CT. Syringe aspiration yielded a total  volume of 60 cc purulent fluid which completely collapsed the small abscess. Catheter secured with a Prolene suture and connected to external suction bulb. Sterile dressing applied. No immediate complication. Patient tolerated the procedure well. IMPRESSION: Successful CT-guided right trans gluteal pelvic abscess drain placement. Electronically Signed   By: Jerilynn Mages.  Shick M.D.   On: 11/01/2021 13:49   ? ?Anti-infectives: ?Anti-infectives (From admission, onward)  ? ? Start     Dose/Rate Route Frequency Ordered Stop  ? 10/28/21 1300  piperacillin-tazobactam (ZOSYN) IVPB 3.375 g       ? 3.375 g ?12.5 mL/hr over 240 Minutes Intravenous Every 8 hours 10/28/21 1022    ? 10/25/21 1500  piperacillin-tazobactam (ZOSYN) IVPB 3.375 g       ? 3.375 g ?12.5 mL/hr over 240 Minutes Intravenous Every 8 hours 10/25/21 1341 10/28/21 0911  ? 10/25/21 0830  piperacillin-tazobactam (ZOSYN) IVPB 3.375 g       ? 3.375 g ?100 mL/hr over 30 Minutes Intravenous  Once 10/25/21 0815 10/25/21 0924  ? ?  ? ? ? ?Assessment/Plan ?Perforated appendicitis ?POD#8 s/p laparoscopic appendectomy 4/9 Dr. Thermon Leyland  ?- CT 4/14 with multiple focal fluid collections in pelvis concerning for developing abscesses and/or peritonitis. Small amount of free fluid in the perihepatic space. small right pleural effusion with atelectasis at the lung bases. ?- tolerating soft diet and having bowel function, flatus and loose stools ?- JP drain purulent, continue to monitor output, likely plan to discharge home with drain. Output 20 cc/24 h ?- s/p IR transgluteal drain placement 4/16. 160 cc output/24h. Monitor culture ?- continue IV antibiotics ?- BMP/CBC pending this am. Afebrile. Refused afternoon K dose yesterday but willing to take it this am ?- Pleural effusion/cough - cough resolved. monitor. encouraged IS ?  ?FEN: soft, IVF(dextrose+K)@50  ml/hr, K 40 mg bid ?ID: zosyn 4/9>> ?VTE: lovenox ? ?Obesity BMI 38.76 ? ? LOS: 8 days  ? ? ?Winferd Humphrey, PA-C ?[facility] ?11/02/2021, 9:34 AM ?Please see Amion for pager number during day hours 7:00am-4:30pm ? ?

## 2021-11-02 NOTE — Progress Notes (Signed)
Patient continues to refuse taking his potassium despite with low potassium levels and being educated. ?

## 2021-11-02 NOTE — Progress Notes (Signed)
Rt Transgluteal pelvic abscess drain placed in IR 4/16 ? ?Drain Location: Rt TG ?Size: Fr size: 10 Fr ?Date of placement: 11/01/21  ?Currently to: Drain collection device: suction bulb ?24 hour output:  ?Output by Drain (mL) 10/31/21 0701 - 10/31/21 1900 10/31/21 1901 - 11/01/21 0700 11/01/21 0701 - 11/01/21 1900 11/01/21 1901 - 11/02/21 0700 11/02/21 0701 - 11/02/21 1025  ?Closed System Drain 1 LLQ Bulb (JP) 19 Fr.    20   ?Closed System Drain 1 Inferior;Lateral;Left Back Bulb (JP) 10 Fr.   60 100   ? ? ?Interval imaging/drain manipulation:  ?None ? ?Current examination: ?Flushes/aspirates easily.  ?Insertion site unremarkable. ?Suture and stat lock in place. ?Dressed appropriately.  ?OP dark serous color today ?Cx pending ? ?Plan: ?Continue TID flushes with 5 cc NS. ?Record output Q shift. ?Dressing changes QD or PRN if soiled.  ?Call IR APP or on call IR MD if difficulty flushing or sudden change in drain output.  ?Repeat imaging/possible drain injection once output < 10 mL/QD (excluding flush material.) ? ?Discharge planning: ?Please contact IR APP or on call IR MD prior to patient d/c to ensure appropriate follow up plans are in place. Typically patient will follow up with IR clinic 10-14 days post d/c for repeat imaging/possible drain injection. IR scheduler will contact patient with date/time of appointment. Patient will need to flush drain QD with 5 cc NS, record output QD, dressing changes every 2-3 days or earlier if soiled.  ? ?IR will continue to follow - please call with questions or concerns. ? ?  ?

## 2021-11-03 ENCOUNTER — Other Ambulatory Visit (HOSPITAL_COMMUNITY): Payer: Self-pay

## 2021-11-03 ENCOUNTER — Encounter (HOSPITAL_COMMUNITY): Payer: Self-pay | Admitting: *Deleted

## 2021-11-03 ENCOUNTER — Other Ambulatory Visit (HOSPITAL_COMMUNITY): Payer: Self-pay | Admitting: Radiology

## 2021-11-03 DIAGNOSIS — K3532 Acute appendicitis with perforation and localized peritonitis, without abscess: Secondary | ICD-10-CM

## 2021-11-03 LAB — CBC
HCT: 39.4 % (ref 39.0–52.0)
Hemoglobin: 13.6 g/dL (ref 13.0–17.0)
MCH: 28 pg (ref 26.0–34.0)
MCHC: 34.5 g/dL (ref 30.0–36.0)
MCV: 81.2 fL (ref 80.0–100.0)
Platelets: 500 10*3/uL — ABNORMAL HIGH (ref 150–400)
RBC: 4.85 MIL/uL (ref 4.22–5.81)
RDW: 13.2 % (ref 11.5–15.5)
WBC: 19 10*3/uL — ABNORMAL HIGH (ref 4.0–10.5)
nRBC: 0 % (ref 0.0–0.2)

## 2021-11-03 LAB — BASIC METABOLIC PANEL
Anion gap: 10 (ref 5–15)
BUN: 5 mg/dL — ABNORMAL LOW (ref 6–20)
CO2: 28 mmol/L (ref 22–32)
Calcium: 8.7 mg/dL — ABNORMAL LOW (ref 8.9–10.3)
Chloride: 96 mmol/L — ABNORMAL LOW (ref 98–111)
Creatinine, Ser: 1.08 mg/dL (ref 0.61–1.24)
GFR, Estimated: >60 mL/min (ref >60)
Glucose, Bld: 90 mg/dL (ref 70–99)
Potassium: 3.8 mmol/L (ref 3.5–5.1)
Sodium: 134 mmol/L — ABNORMAL LOW (ref 135–145)

## 2021-11-03 MED ORDER — METHOCARBAMOL 500 MG PO TABS
500.0000 mg | ORAL_TABLET | Freq: Three times a day (TID) | ORAL | 0 refills | Status: AC | PRN
  Filled 2021-11-03: qty 9, 3d supply, fill #0

## 2021-11-03 MED ORDER — TRAMADOL HCL 50 MG PO TABS
100.0000 mg | ORAL_TABLET | Freq: Four times a day (QID) | ORAL | 0 refills | Status: AC | PRN
  Filled 2021-11-03: qty 24, 3d supply, fill #0

## 2021-11-03 MED ORDER — AMOXICILLIN-POT CLAVULANATE 875-125 MG PO TABS
1.0000 | ORAL_TABLET | Freq: Two times a day (BID) | ORAL | 0 refills | Status: AC
  Filled 2021-11-03: qty 10, 5d supply, fill #0

## 2021-11-03 NOTE — Progress Notes (Signed)
? ? ?9 Days Post-Op  ?Subjective: ?CC: ?Doing well. No abdominal pain. Only pain is at his TG IR drain site. Tolerating diet without n/v. Passing flatus. BM yesterday. Voiding. Mobilizing in halls. Learned how to care for surgical drain. Significant other learned how to care for IR drain.  ? ?Objective: ?Vital signs in last 24 hours: ?Temp:  [97.6 ?F (36.4 ?C)-99.1 ?F (37.3 ?C)] 97.6 ?F (36.4 ?C) (04/18 2202) ?Pulse Rate:  [87-97] 87 (04/18 5427) ?Resp:  [18] 18 (04/18 0623) ?BP: (133-134)/(82-88) 134/88 (04/18 7628) ?SpO2:  [95 %-98 %] 98 % (04/18 3151) ?Last BM Date : 11/02/21 ? ?Intake/Output from previous day: ?04/17 0701 - 04/18 0700 ?In: 744 [P.O.:240; IV Piggyback:484] ?Out: 730 [Urine:650; Drains:80] ?Intake/Output this shift: ?Total I/O ?In: 220 [P.O.:220] ?Out: -  ? ?PE: ?Gen:  Alert, NAD ?Cardio: regular rate ?Pulm: CTA b/l. Rate and effort normal on room air ?Abd: soft, no distension. NT. LLQ drain which is purulent. Laparoscopic incisions cdi. IR transgluteal drain with cloudy SS fluid  ? ?Lab Results:  ?Recent Labs  ?  11/02/21 ?1145 11/03/21 ?0644  ?WBC 24.0* 19.0*  ?HGB 13.4 13.6  ?HCT 38.5* 39.4  ?PLT 473* 500*  ? ?BMET ?Recent Labs  ?  11/02/21 ?1145 11/03/21 ?0644  ?NA 136 134*  ?K 3.7 3.8  ?CL 98 96*  ?CO2 28 28  ?GLUCOSE 93 90  ?BUN <5* 5*  ?CREATININE 1.26* 1.08  ?CALCIUM 8.8* 8.7*  ? ?PT/INR ?Recent Labs  ?  11/01/21 ?1554  ?LABPROT 14.2  ?INR 1.1  ? ?CMP  ?   ?Component Value Date/Time  ? NA 134 (L) 11/03/2021 0644  ? K 3.8 11/03/2021 0644  ? CL 96 (L) 11/03/2021 0644  ? CO2 28 11/03/2021 0644  ? GLUCOSE 90 11/03/2021 0644  ? BUN 5 (L) 11/03/2021 7616  ? CREATININE 1.08 11/03/2021 0644  ? CALCIUM 8.7 (L) 11/03/2021 0644  ? PROT 8.6 (H) 10/25/2021 0737  ? ALBUMIN 3.7 10/25/2021 0628  ? AST 21 10/25/2021 0628  ? ALT 26 10/25/2021 0628  ? ALKPHOS 96 10/25/2021 0628  ? BILITOT 1.9 (H) 10/25/2021 1062  ? GFRNONAA >60 11/03/2021 0644  ? ?Lipase  ?   ?Component Value Date/Time  ? LIPASE 17  10/25/2021 0628  ? ? ?Studies/Results: ?No results found. ? ?Anti-infectives: ?Anti-infectives (From admission, onward)  ? ? Start     Dose/Rate Route Frequency Ordered Stop  ? 10/28/21 1300  piperacillin-tazobactam (ZOSYN) IVPB 3.375 g       ? 3.375 g ?12.5 mL/hr over 240 Minutes Intravenous Every 8 hours 10/28/21 1022    ? 10/25/21 1500  piperacillin-tazobactam (ZOSYN) IVPB 3.375 g       ? 3.375 g ?12.5 mL/hr over 240 Minutes Intravenous Every 8 hours 10/25/21 1341 10/28/21 0911  ? 10/25/21 0830  piperacillin-tazobactam (ZOSYN) IVPB 3.375 g       ? 3.375 g ?100 mL/hr over 30 Minutes Intravenous  Once 10/25/21 0815 10/25/21 0924  ? ?  ? ? ? ?Assessment/Plan ?POD#9 s/p laparoscopic appendectomy 4/9 Dr. Dossie Der for Perforated appendicitis ?- S/p IR drain 4/16. Cx w/ strep anginosis, pcn sensitive. Plan for 5d of abx after source control ?- Cont surgical drain and IR drain at d/c. Drain teaching. Reached out to IR for further drain teaching and to arrange f/u prior to discharge.  ?- On POD9, the patient was voiding well, tolerating diet, ambulating well, pain well controlled, vital signs stable, incisions c/d/I, drain output stable, wbc downtrending and felt  stable for discharge home. My colleague will follow up on discharge. Plan to continue abx for total of 5d after IR drain placement (d/c on Augementin) and keep surgical drain at d/c w/ f/u with Dr. Dossie Der in 2 weeks. Will need IR f/u and drain teaching. Discussed discharge instructions, restrictions and return precautions with patient.  ?  ? LOS: 9 days  ? ? ?Jacinto Halim , PA-C ?[facility] ?11/03/2021, 12:06 PM ?Please see Amion for pager number during day hours 7:00am-4:30pm ? ?

## 2021-11-03 NOTE — Plan of Care (Signed)
Pt ready for DC to home, understands how to perform flushing of 2 JP drains. DC instructions given and reviewed with pt and family. Letter provided. ?

## 2021-11-03 NOTE — Progress Notes (Signed)
Brad Lyons to be D/C'd  per MD order.  Discussed with the patient and all questions fully answered. ? ?VSS, Skin clean, dry and intact without evidence of skin break down, no evidence of skin tears noted. ? ?IV catheter discontinued intact. Site without signs and symptoms of complications. Dressing and pressure applied. ? ?An After Visit Summary was printed and given to the patient. Patient received flushes and drain sponges for JP drains. ? ?D/c education completed with patient/family including follow up instructions, medication list, d/c activities limitations if indicated, with other d/c instructions as indicated by MD - patient able to verbalize understanding, all questions fully answered.  ? ?Patient instructed to return to ED, call 911, or call MD for any changes in condition.  ? ?Patient to be escorted via WC, and D/C home via private auto.  ?

## 2021-11-03 NOTE — Discharge Instructions (Addendum)
You will need to flush your drain in your right buttock with 5 mls normal saline and record the output daily. Change your dressing changes every 2-3 days or earlier if soiled.  ? ?CCS ______[facility] ?LAPAROSCOPIC SURGERY: POST OP INSTRUCTIONS ?Always review your discharge instruction sheet given to you by the facility where your surgery was performed. ?IF YOU HAVE DISABILITY OR FAMILY LEAVE FORMS, YOU MUST BRING THEM TO THE OFFICE FOR PROCESSING.   ?DO NOT GIVE THEM TO YOUR DOCTOR. ? ?A prescription for pain medication may be given to you upon discharge.  Take your pain medication as prescribed, if needed.  If narcotic pain medicine is not needed, then you may take acetaminophen (Tylenol) or ibuprofen (Advil) as needed. ?Take your usually prescribed medications unless otherwise directed. ?If you need a refill on your pain medication, please contact your pharmacy.  They will contact our office to request authorization. Prescriptions will not be filled after 5pm or on week-ends. ?You should follow a light diet the first few days after arrival home, such as soup and crackers, etc.  Be sure to include lots of fluids daily. ?Most patients will experience some swelling and bruising in the area of the incisions.  Ice packs will help.  Swelling and bruising can take several days to resolve.  ?It is common to experience some constipation if taking pain medication after surgery.  Increasing fluid intake and taking a stool softener (such as Colace) will usually help or prevent this problem from occurring.  A mild laxative (Milk of Magnesia or Miralax) should be taken according to package instructions if there are no bowel movements after 48 hours. ?Unless discharge instructions indicate otherwise, you may remove your bandages 24-48 hours after surgery, and you may shower at that time.  You may have steri-strips (small skin tapes) in place directly over the incision.  These strips should be left on the  skin for 7-10 days.  If your surgeon used skin glue on the incision, you may shower in 24 hours.  The glue will flake off over the next 2-3 weeks.  Any sutures or staples will be removed at the office during your follow-up visit. ?ACTIVITIES:  You may resume regular (light) daily activities beginning the next day--such as daily self-care, walking, climbing stairs--gradually increasing activities as tolerated.  You may have sexual intercourse when it is comfortable.  Refrain from any heavy lifting or straining until approved by your doctor. ?You may drive when you are no longer taking prescription pain medication, you can comfortably wear a seatbelt, and you can safely maneuver your car and apply brakes. ?RETURN TO WORK:  __________________________________________________________ ?You should see your doctor in the office for a follow-up appointment approximately 2-3 weeks after your surgery.  Make sure that you call for this appointment within a day or two after you arrive home to insure a convenient appointment time. ?OTHER INSTRUCTIONS: __________________________________________________________________________________________________________________________ __________________________________________________________________________________________________________________________ ?WHEN TO CALL YOUR DOCTOR: ?Fever over 101.0 ?Inability to urinate ?Continued bleeding from incision. ?Increased pain, redness, or drainage from the incision. ?Increasing abdominal pain ? ?The clinic staff is available to answer your questions during regular business hours.  Please don?t hesitate to call and ask to speak to one of the nurses for clinical concerns.  If you have a medical emergency, go to the nearest emergency room or call 911.  A surgeon from [facility] is always on call at the hospital. ?[address], [address], [place], [place]  [address] ? [address] [address], [place], [place]   [address] ?[phone] ?  [phone] ? FAX [phone] ?Web site: [email or web address] ? ?

## 2021-11-03 NOTE — Progress Notes (Signed)
? ? ?Referring Physician(s): ?B Meuth PA ? ?Supervising Physician: Jacqulynn Cadet ? ?Patient Status:  [facility] - In-pt ? ?Chief Complaint: ? ?Perforated appendix s/p left transgluteal posterior pelvic cult de sac abscess s/p drain placement on 4.16.23  by Dr. Annamaria Boots ? ?Subjective: ? ?Patient states that overall he is feeling better. Family at bedside. Denies any concerns or questions regarding the drain. Requesting cheerios.  ? ?Allergies: ?Patient has no known allergies. ? ?Medications: ?Prior to Admission medications   ?Medication Sig Start Date End Date Taking? Authorizing Provider  ?acetaminophen (TYLENOL) 500 MG tablet Take 500-1,000 mg by mouth every 6 (six) hours as needed for mild pain or headache (Tooth ache).   Yes [provider]  ?amoxicillin-clavulanate (AUGMENTIN) 875-125 MG tablet Take 1 tablet by mouth 2 (two) times daily for 5 days. 11/03/21 11/08/21 Yes Winferd Humphrey, PA-C  ?bismuth subsalicylate (PEPTO BISMOL) 262 MG chewable tablet Chew 262-524 mg by mouth as needed for indigestion.   Yes [provider]  ?ondansetron (ZOFRAN) 4 MG tablet Take 1 tablet (4 mg total) by mouth every 8 (eight) hours as needed for nausea or vomiting. 10/21/21  Yes Meccariello, Bernita Raisin, DO  ?polyethylene glycol powder (GLYCOLAX/MIRALAX) 17 GM/SCOOP powder Take 17 g by mouth daily as needed for mild constipation.   Yes [provider]  ?methocarbamol (ROBAXIN) 500 MG tablet Take 1 tablet (500 mg total) by mouth every 8 (eight) hours as needed for up to 3 days for muscle spasms. 11/03/21 11/06/21  Winferd Humphrey, PA-C  ?traMADol (ULTRAM) 50 MG tablet Take 2 tablets (100 mg total) by mouth every 6 (six) hours as needed for up to 3 days for moderate pain or severe pain. 11/03/21 11/06/21  Winferd Humphrey, PA-C  ? ? ? ?Vital Signs: ?BP 134/88 (BP Location: Left Arm)   Pulse 87   Temp 97.6 ?F (36.4 ?C) (Oral)   Resp 18   Ht 5' 5.98" (1.676 m)   Wt 240 lb 0.2 oz (108.9 kg)   SpO2 98%   BMI  38.76 kg/m?  ? ?Physical Exam ?Vitals and nursing note reviewed.  ?Constitutional:   ?   Appearance: He is well-developed.  ?HENT:  ?   Head: Normocephalic.  ?Pulmonary:  ?   Effort: Pulmonary effort is normal.  ?Musculoskeletal:     ?   General: Normal range of motion.  ?   Cervical back: Normal range of motion.  ?Skin: ?   General: Skin is dry.  ?Neurological:  ?   Mental Status: He is alert and oriented to person, place, and time.  ? ? ?Imaging: ?CT ABDOMEN PELVIS W CONTRAST ? ?Result Date: 10/30/2021 ?CLINICAL DATA:  Postop abdominal pain. EXAM: CT ABDOMEN AND PELVIS WITH CONTRAST TECHNIQUE: Multidetector CT imaging of the abdomen and pelvis was performed using the standard protocol following bolus administration of intravenous contrast. RADIATION DOSE REDUCTION: This exam was performed according to the departmental dose-optimization program which includes automated exposure control, adjustment of the mA and/or kV according to patient size and/or use of iterative reconstruction technique. CONTRAST:  180mL OMNIPAQUE IOHEXOL 300 MG/ML  SOLN COMPARISON:  10/25/2021. FINDINGS: Lower chest: There is a small right pleural effusion with atelectasis at the lung bases. The heart is normal in size. Nonspecific prominent lymph nodes are present in the epicardial fat pad on the right. Hepatobiliary: No focal liver abnormality is seen. No biliary ductal dilatation. There is mild gallbladder wall thickening with no evidence of cholelithiasis. Pancreas: Unremarkable. No pancreatic ductal  dilatation or surrounding inflammatory changes. Spleen: Normal in size without focal abnormality. Adrenals/Urinary Tract: Adrenal glands are unremarkable. Kidneys are normal, without renal calculi, focal lesion, or hydronephrosis. Bladder is unremarkable. Stomach/Bowel: The stomach is within normal limits. The patient is status post presumed appendectomy with surgical changes at the cecum. A surgical drain enters the abdomen in the left lower  quadrant and terminates in the mid right abdomen. The previously described collection in the right lower quadrant is decreased in size now measuring 2.6 cm. Multiple additional fluid collections are noted in the pelvis: Right pericolic gutter measuring 2.0 x 1.3 cm, axial image 45, right lower quadrant measuring 3.2 x 1.8 cm, axial image 54, in the rectovesical pouch measuring 5.7 x 4.9 cm with thickening of the peritoneal wall, axial image 77, and in the pelvis anteriorly in the midline measuring 2.3 x 6.2 cm, axial image 78. Vascular/Lymphatic: No significant vascular findings. Prominent lymph nodes are present in the retroperitoneum and mesenteric root which are likely reactive. Reproductive: Prostate is unremarkable. Other: Small amount of perihepatic free fluid. Musculoskeletal: Fixation hardware is present in the proximal right femur. No acute osseous abnormality. IMPRESSION: 1. Status post appendectomy with surgical drain in place and multiple focal fluid collections in the pelvis, concerning for possible developing abscesses and/or peritonitis. 2. Small amount of free fluid in the perihepatic space. 3. Small right pleural effusion with atelectasis at the lung bases. 4. Mild gallbladder wall thickening, likely related to localized inflammatory changes. Electronically Signed   By: Brett Fairy M.D.   On: 10/30/2021 20:50  ? ?CT IMAGE GUIDED DRAINAGE BY PERCUTANEOUS CATHETER ? ?Result Date: 11/01/2021 ?INDICATION: Posterior pelvic cul-de-sac abscess, perforated appendicitis EXAM: RIGHT TRANS GLUTEAL CT DRAINAGE OF THE PELVIC ABSCESS MEDICATIONS: The patient is currently admitted to the hospital and receiving intravenous antibiotics. The antibiotics were administered within an appropriate time frame prior to the initiation of the procedure. ANESTHESIA/SEDATION: Moderate (conscious) sedation was employed during this procedure. A total of Versed 4.0 mg and Fentanyl 100 mcg was administered intravenously by the  radiology nurse. Total intra-service moderate Sedation Time: 25 MINUTE minutes. The patient's level of consciousness and vital signs were monitored continuously by radiology nursing throughout the procedure under my direct supervision. COMPLICATIONS: None immediate. PROCEDURE: Informed written consent was obtained from the patient after a thorough discussion of the procedural risks, benefits and alternatives. All questions were addressed. Maximal Sterile Barrier Technique was utilized including caps, mask, sterile gowns, sterile gloves, sterile drape, hand hygiene and skin antiseptic. A timeout was performed prior to the initiation of the procedure. Previous imaging reviewed. Patient positioned nearly prone and noncontrast localization CT performed. The posterior pelvic cul-de-sac fluid collection was localized and marked for a right trans gluteal approach. Under sterile conditions and local anesthesia, an 18 gauge 15 cm access needle was advanced from a right transgluteal approach into the fluid collection. Needle position confirmed with CT. Syringe aspiration yielded purulent fluid. Sample sent for culture. Guidewire inserted followed by tract dilatation insert a 10 French drain. Drain catheter position confirmed with CT. Syringe aspiration yielded a total volume of 60 cc purulent fluid which completely collapsed the small abscess. Catheter secured with a Prolene suture and connected to external suction bulb. Sterile dressing applied. No immediate complication. Patient tolerated the procedure well. IMPRESSION: Successful CT-guided right trans gluteal pelvic abscess drain placement. Electronically Signed   By: Jerilynn Mages.  Shick M.D.   On: 11/01/2021 13:49   ? ?Labs: ? ?CBC: ?Recent Labs  ?  10/31/21 ?  [id number] 11/01/21 ?1554 11/02/21 ?1145 11/03/21 ?0644  ?WBC 30.2* 25.0* 24.0* 19.0*  ?HGB 14.4 13.1 13.4 13.6  ?HCT 40.0 37.2* 38.5* 39.4  ?PLT 455* 476* 473* 500*  ? ? ?COAGS: ?Recent Labs  ?  11/01/21 ?1554  ?INR 1.1   ? ? ?BMP: ?Recent Labs  ?  10/31/21 ?0916 11/01/21 ?1554 11/02/21 ?1145 11/03/21 ?0644  ?NA 137 133* 136 134*  ?K 3.4* 3.4* 3.7 3.8  ?CL 102 99 98 96*  ?CO2 26 24 28 28   ?GLUCOSE 91 104* 93 90  ?BUN <5* <5* <5* 5*  ?CALCIUM

## 2021-11-04 ENCOUNTER — Other Ambulatory Visit (HOSPITAL_COMMUNITY): Payer: Self-pay | Admitting: Radiology

## 2021-11-04 ENCOUNTER — Other Ambulatory Visit: Payer: Self-pay | Admitting: Surgery

## 2021-11-04 DIAGNOSIS — K3532 Acute appendicitis with perforation and localized peritonitis, without abscess: Secondary | ICD-10-CM

## 2021-11-04 LAB — AEROBIC/ANAEROBIC CULTURE W GRAM STAIN (SURGICAL/DEEP WOUND)

## 2021-11-06 NOTE — Discharge Summary (Signed)
[facility] ?Discharge Summary  ? ?Patient ID: ?Brad Lyons ?MRN: [id number] ?DOB/AGE: March 13, 1996 26 y.o. ? ?Admit date: 10/25/2021 ?Discharge date: 11/03/2021 ? ?Admitting Diagnosis: ?Dehydration [E86.0] ?Small bowel obstruction (HCC) [K56.609] ?Acute appendicitis with rupture [K35.32] ?Acute kidney injury (HCC) [N17.9] ?Acute perforated appendicitis [K35.32] ? ? ?Discharge Diagnosis ?Acute appendicitis with rupture [K35.32] ?Pelvic abscess ?S/p laparoscopic appendectomy  ?S/p IR abscess drain ? ? ?Consultants ?Interventional Radiology - Dr. Miles Costain ? ?Imaging: ?No results found. ? ?Procedures ?Dr. Dossie Der (10/25/21) - Laparoscopic Appendectomy with drain placement ? ?Hospital Course:  ?26 year old male who presented to [facility] ED with abdominal pain.  Workup showed perforated appendicitis.  Patient was admitted and underwent procedure listed above with findings of purulent peritonitis.  Tolerated procedure well and was transferred to the floor. Due to purulent peritonitis he initially had a NGT in place post operatively for ileus. As ileus improved NGT was clamped and ultimately removed. Diet was advanced as tolerated. Patient had AKI on admission likely related to dehydration and renal function improved with hydration including IV hydration during admission. ?Patient remained on IV antibiotics post operatively and WBC remained elevated. Repeat CT scan on 4/14 showed focal fluid collection in pelvis concerning for abscess and IR was consulted for drain placement as above. During admission his surgical drain became purulent. Surgical drain was left in place on discharge. ?On POD9, the patient was voiding well, tolerating diet, ambulating well, pain well controlled, vital signs stable, incisions c/d/i and felt stable for discharge home with surgical and IR drain. IR drain culture with strep anginosis, penicillin sensitive and patient discharged with antibiotic to complete 5 days following source control.  Patient will follow up in our office in 2-3 weeks and knows to call with questions or concerns.  He was instructed on drain care prior to discharge and will follow up with IR for drain management. ? ?I or a member of my team have reviewed this patient in the Controlled Substance Database. ? ? ?Allergies as of 11/03/2021   ?No Known Allergies ?  ? ?  ?Medication List  ?  ? ?TAKE these medications   ? ?acetaminophen 500 MG tablet ?Commonly known as: TYLENOL ?Take 500-1,000 mg by mouth every 6 (six) hours as needed for mild pain or headache (Tooth ache). ?  ?amoxicillin-clavulanate 875-125 MG tablet ?Commonly known as: Augmentin ?Take 1 tablet by mouth 2 (two) times daily for 5 days. ?  ?bismuth subsalicylate 262 MG chewable tablet ?Commonly known as: PEPTO BISMOL ?Chew 262-524 mg by mouth as needed for indigestion. ?  ?methocarbamol 500 MG tablet ?Commonly known as: ROBAXIN ?Take 1 tablet (500 mg total) by mouth every 8 (eight) hours as needed for up to 3 days for muscle spasms. ?  ?ondansetron 4 MG tablet ?Commonly known as: ZOFRAN ?Take 1 tablet (4 mg total) by mouth every 8 (eight) hours as needed for nausea or vomiting. ?  ?polyethylene glycol powder 17 GM/SCOOP powder ?Commonly known as: GLYCOLAX/MIRALAX ?Take 17 g by mouth daily as needed for mild constipation. ?  ?traMADol 50 MG tablet ?Commonly known as: ULTRAM ?Take 2 tablets (100 mg total) by mouth every 6 (six) hours as needed for up to 3 days for moderate pain or severe pain. ?  ? ?  ? ? ? ? Follow-up Information   ? ? Stechschulte, Hyman Hopes, MD. Go on 11/12/2021.   ?Specialty: Surgery ?Why: follow up on 4/27 at 9:40 am. please arrive 30 minutes early to complete check in process  and bring photo ID and insurance card if you have them ?Contact information: ?[address] ?[address] ?[place] [address] ?[phone] ? ? ?  ?  ? ? Berdine Dance, MD Follow up.   ?Specialties: Interventional Radiology, Radiology ?Why: someone with IR will contact you to  schedule follow up. If you do not receive a call in the next week please call to confirm appointment date and time ?Contact information: ?[address] ?[address] ?[place] [address] ?[phone] ? ? ?  ?  ? ?  ?  ? ?  ? ? ?Signed: ?Eric Form , PA-C ?[facility] ?11/06/2021, 4:40 PM ?Please see Amion for pager number during day hours 7:00am-4:30pm ? ? ? ?

## 2021-11-12 ENCOUNTER — Ambulatory Visit
Admission: RE | Admit: 2021-11-12 | Discharge: 2021-11-12 | Disposition: A | Discharge status: Home / Self Care | Payer: Self-pay | Source: Ambulatory Visit | Attending: Radiology | Admitting: Radiology

## 2021-11-12 ENCOUNTER — Encounter: Payer: Self-pay | Admitting: *Deleted

## 2021-11-12 ENCOUNTER — Ambulatory Visit
Admission: RE | Admit: 2021-11-12 | Discharge: 2021-11-12 | Disposition: A | Discharge status: Home / Self Care | Payer: Self-pay | Source: Ambulatory Visit | Attending: Surgery | Admitting: Surgery

## 2021-11-12 DIAGNOSIS — K3532 Acute appendicitis with perforation and localized peritonitis, without abscess: Secondary | ICD-10-CM

## 2021-11-12 HISTORY — PX: IR RADIOLOGIST EVAL & MGMT: IMG5224

## 2021-11-12 MED ORDER — IOPAMIDOL (ISOVUE-300) INJECTION 61%
100.0000 mL | Freq: Once | INTRAVENOUS | Status: AC | PRN
Start: 2021-11-12 — End: 2021-11-12
  Administered 2021-11-12: 100 mL via INTRAVENOUS

## 2021-11-12 NOTE — Progress Notes (Signed)
? ?Referring Physician(s): ?Stechschulte,Paul J ? ?Chief Complaint: ?The patient is seen in follow up today s/p appendicitis with perforation and abscess formation with right transgluteal drain placed 11/01/21 by Dr. Miles Costain ? ?History of present illness: ? ?Brad Lyons, 26 year old male, presented to the ED 10/25/21 with abdominal pain, fevers, nausea and vomiting. He was found to be tachycardic with leukocytosis and CT imaging revealed a perforated appendix with abscess formation. He was taken to the OR the same day for a laparoscopic appendectomy with drain placement. Post-procedure he developed multiple focal fluid collections in the pelvis and he was seen in IR 11/01/21 for a right pelvic abscess drain placement, transgluteal approach. He was ultimately discharged home 11/03/21 with two drains in place - one placed by Surgery 10/25/21 and the right pelvic abscess drain placed by IR 11/01/21. ? ?He presents today to the [facility] Radiology outpatient clinic for evaluation of the right pelvic abscess drain. He completed his course of oral antibiotics this morning and reports scant daily drain output. He has been flushing the drain once daily with 5 ml NS. He denies fevers, chills, nausea, vomiting or diarrhea. He reports a good appetite and good energy levels. He does endorse some mild discomfort at each drain site.  ? ?Past Medical History:  ?Diagnosis Date  ? GERD (gastroesophageal reflux disease)   ? ? ?Past Surgical History:  ?Procedure Laterality Date  ? LAPAROSCOPIC APPENDECTOMY  10/25/2021  ? Procedure: APPENDECTOMY LAPAROSCOPIC;  Surgeon: Quentin Ore, MD;  Location: [facility] OR;  Service: General;;  ? LAPAROSCOPY N/A 10/25/2021  ? Procedure: LAPAROSCOPY DIAGNOSTIC;  Surgeon: Quentin Ore, MD;  Location: [facility] OR;  Service: General;  Laterality: N/A;  ? ? ?Allergies: ?Patient has no known allergies. ? ?Medications: ?Prior to Admission medications   ?Medication Sig Start Date End Date Taking? Authorizing  Provider  ?acetaminophen (TYLENOL) 500 MG tablet Take 500-1,000 mg by mouth every 6 (six) hours as needed for mild pain or headache (Tooth ache).    [provider]  ?bismuth subsalicylate (PEPTO BISMOL) 262 MG chewable tablet Chew 262-524 mg by mouth as needed for indigestion.    [provider]  ?ondansetron (ZOFRAN) 4 MG tablet Take 1 tablet (4 mg total) by mouth every 8 (eight) hours as needed for nausea or vomiting. 10/21/21   Meccariello, Solmon Ice, DO  ?polyethylene glycol powder (GLYCOLAX/MIRALAX) 17 GM/SCOOP powder Take 17 g by mouth daily as needed for mild constipation.    [provider]  ?  ? ?Family History  ?Problem Relation Age of Onset  ? Healthy Father   ? ? ?Social History  ? ?Socioeconomic History  ? Marital status: Single  ?  Spouse name: Not on file  ? Number of children: Not on file  ? Years of education: Not on file  ? Highest education level: Not on file  ?Occupational History  ? Not on file  ?Tobacco Use  ? Smoking status: Every Day  ?  Types: Cigarettes, Cigars  ? Smokeless tobacco: Never  ?Substance and Sexual Activity  ? Alcohol use: Not on file  ? Drug use: Yes  ?  Types: Marijuana  ? Sexual activity: Not on file  ?Other Topics Concern  ? Not on file  ?Social History Narrative  ? Not on file  ? ?Social Determinants of Health  ? ?Financial Resource Strain: Not on file  ?Food Insecurity: Not on file  ?Transportation Needs: Not on file  ?Physical Activity: Not on file  ?Stress: Not on file  ?  Social Connections: Not on file  ? ? ?Vital Signs: ?There were no vitals taken for this visit. ? ?Physical Exam ?Constitutional:   ?   General: He is not in acute distress. ?   Appearance: He is not ill-appearing.  ?Pulmonary:  ?   Effort: Pulmonary effort is normal.  ?Abdominal:  ?   Comments: Left abdominal surgical drain. Site is clean, dry and without erythema or drainage. Right transgluteal drain to suction. Scant serous fluid in bulb. Skin insertion site is clean, dry and  without erythema or drainage. Suture intact.   ?Musculoskeletal:     ?   General: Normal range of motion.  ?Skin: ?   General: Skin is warm and dry.  ?Neurological:  ?   Mental Status: He is alert and oriented to person, place, and time.  ? ? ?Imaging: ?CT ABDOMEN PELVIS W CONTRAST ? ?Result Date: 11/12/2021 ?CLINICAL DATA:  Perforated appendicitis, status post percutaneous drain, outpatient follow-up EXAM: CT ABDOMEN AND PELVIS WITH CONTRAST TECHNIQUE: Multidetector CT imaging of the abdomen and pelvis was performed using the standard protocol following bolus administration of intravenous contrast. RADIATION DOSE REDUCTION: This exam was performed according to the departmental dose-optimization program which includes automated exposure control, adjustment of the mA and/or kV according to patient size and/or use of iterative reconstruction technique. CONTRAST:  100mL ISOVUE-300 IOPAMIDOL (ISOVUE-300) INJECTION 61% COMPARISON:  10/30/2021, 11/01/2021 FINDINGS: Lower chest: Slight increase in the dependent layering right effusion with right lower lobe atelectasis. No left effusion. Normal heart size. No pericardial effusion. Hepatobiliary: No focal hepatic abnormality or biliary dilatation. Cholelithiasis again noted, largest gallstone measures 23 mm, image 36/2. Gallbladder remains nondistended. No surrounding inflammatory change. Common bile duct nondilated. In the anterior epigastric region along the falciform ligament, there is an ill-defined area of soft tissue attenuation with a small amount of ill-defined fluid within the peritoneal fat or omentum measuring 33 mm, previously 20 mm, images 33 through 37/2. There is an adjacent 10 mm lymph node. Appearance remains nonspecific for an area of inflammation/necrosis in the falciform fat versus small developing abscess. No significant drainable component. Just inferior to the right liver, there is a similar area of ill-defined soft tissue and fluid density measuring 21  mm, image 44/2, also smaller. Findings consistent with residual inflammation in this region also. Pancreas: Unremarkable. No pancreatic ductal dilatation or surrounding inflammatory changes. Spleen: Normal in size without focal abnormality. Adrenals/Urinary Tract: Adrenal glands are unremarkable. Kidneys are normal, without renal calculi, focal lesion, or hydronephrosis. Bladder is unremarkable. Stomach/Bowel: Negative for bowel obstruction, significant dilatation, ileus, or free air. Remote appendectomy changes noted. Left lower quadrant surgical drain extends across the lower abdomen into the right pericolic gutter as before. Surgical drain is stable in position. Continued improvement in the right abdominal mesenteric strandy edema and inflammation. No new abdominal or pelvic fluid collections. Pelvic cul-de-sac fluid has resolved. Right trans gluteal drain has migrated into the right gluteal musculature. Vascular/Lymphatic: No significant vascular findings are present. No enlarged abdominal or pelvic lymph nodes. Reproductive: Uterus and bilateral adnexa are unremarkable. Other: No abdominal wall hernia or abnormality. No abdominopelvic ascites. Musculoskeletal: No acute osseous finding. Right femur hardware partially imaged. IMPRESSION: Resolved pelvic cul-de-sac fluid collection following drain placement. CT drain has migrated into the right gluteal soft tissues. This will be removed today. Overall improvement in the abdominopelvic postoperative mesenteric ill-defined fluid and inflammation. Stable surgical drain placement. No new abdominopelvic fluid collections. Anterior epigastric region ill-defined soft tissue and fluid along the  falciform fat favored represent inflammation/fat necrosis, less likely developing abscess. Interval enlargement of the now moderate right effusion with right lower lobe atelectasis. Cholelithiasis Electronically Signed   By: Judie Petit.  Shick M.D.   On: 11/12/2021 13:17    ? ?Labs: ? ?CBC: ?Recent Labs  ?  10/31/21 ?0916 11/01/21 ?1554 11/02/21 ?1145 11/03/21 ?0644  ?WBC 30.2* 25.0* 24.0* 19.0*  ?HGB 14.4 13.1 13.4 13.6  ?HCT 40.0 37.2* 38.5* 39.4  ?PLT 455* 476* 473* 500*  ? ? ?COAGS: ?Rece

## 2021-11-12 NOTE — Progress Notes (Signed)
Patient ID: Brad Lyons, male   DOB: 06-25-1996, 26 y.o.   MRN: [id number] ?I reviewed the history and physical examination and have formulated the assessment and plan in the presence of the patient. ? ?Assessment and Plan: ? ?Pelvic cul-de-sac abscess has resolved.  Right transgluteal drain has retracted into the gluteal musculature.  Stable surgical drain. ? ?Overall improvement in the abdomen pelvis CT.  No new abscess. ? ?Plan: Retracted right transgluteal drain will be removed today.  Patient has outpatient surgical follow-up for management of the surgical drain. ? ? ?A copy of this report was sent to the requesting provider on this date. ? ?Electronically Signed: ?Berdine Dance ?11/12/2021, 2:20 PM ? ? ?I spent a total of 25 Minutes in face to face in clinical consultation, greater than 50% of which was counseling/coordinating care for this patient with a postoperative abscess ? ?

## 2021-11-24 ENCOUNTER — Inpatient Hospital Stay (HOSPITAL_COMMUNITY)
Admission: EM | Admit: 2021-11-24 | Discharge: 2021-11-30 | DRG: 871 | Disposition: A | Discharge status: Home / Self Care | Payer: Self-pay | Attending: General Surgery | Admitting: General Surgery

## 2021-11-24 ENCOUNTER — Encounter (HOSPITAL_COMMUNITY): Payer: Self-pay | Admitting: Emergency Medicine

## 2021-11-24 ENCOUNTER — Emergency Department (HOSPITAL_COMMUNITY): Payer: Self-pay

## 2021-11-24 ENCOUNTER — Other Ambulatory Visit: Payer: Self-pay

## 2021-11-24 DIAGNOSIS — A408 Other streptococcal sepsis: Principal | ICD-10-CM | POA: Diagnosis present

## 2021-11-24 DIAGNOSIS — K8020 Calculus of gallbladder without cholecystitis without obstruction: Secondary | ICD-10-CM | POA: Diagnosis present

## 2021-11-24 DIAGNOSIS — R0789 Other chest pain: Secondary | ICD-10-CM | POA: Diagnosis present

## 2021-11-24 DIAGNOSIS — L0291 Cutaneous abscess, unspecified: Secondary | ICD-10-CM

## 2021-11-24 DIAGNOSIS — K3533 Acute appendicitis with perforation and localized peritonitis, with abscess: Secondary | ICD-10-CM | POA: Diagnosis present

## 2021-11-24 DIAGNOSIS — Z9049 Acquired absence of other specified parts of digestive tract: Secondary | ICD-10-CM

## 2021-11-24 DIAGNOSIS — R188 Other ascites: Secondary | ICD-10-CM | POA: Diagnosis present

## 2021-11-24 DIAGNOSIS — Z20822 Contact with and (suspected) exposure to covid-19: Secondary | ICD-10-CM | POA: Diagnosis present

## 2021-11-24 DIAGNOSIS — K219 Gastro-esophageal reflux disease without esophagitis: Secondary | ICD-10-CM | POA: Diagnosis present

## 2021-11-24 LAB — COMPREHENSIVE METABOLIC PANEL
ALT: 41 U/L (ref 0–44)
AST: 35 U/L (ref 15–41)
Albumin: 3 g/dL — ABNORMAL LOW (ref 3.5–5.0)
Alkaline Phosphatase: 121 U/L (ref 38–126)
Anion gap: 14 (ref 5–15)
BUN: 6 mg/dL (ref 6–20)
CO2: 21 mmol/L — ABNORMAL LOW (ref 22–32)
Calcium: 9.6 mg/dL (ref 8.9–10.3)
Chloride: 99 mmol/L (ref 98–111)
Creatinine, Ser: 1.07 mg/dL (ref 0.61–1.24)
GFR, Estimated: >60 mL/min (ref >60)
Glucose, Bld: 104 mg/dL — ABNORMAL HIGH (ref 70–99)
Potassium: 3.8 mmol/L (ref 3.5–5.1)
Sodium: 134 mmol/L — ABNORMAL LOW (ref 135–145)
Total Bilirubin: 1.4 mg/dL — ABNORMAL HIGH (ref 0.3–1.2)
Total Protein: 8.8 g/dL — ABNORMAL HIGH (ref 6.5–8.1)

## 2021-11-24 LAB — CBC WITH DIFFERENTIAL/PLATELET
Abs Immature Granulocytes: 0.12 10*3/uL — ABNORMAL HIGH (ref 0.00–0.07)
Basophils Absolute: 0.1 10*3/uL (ref 0.0–0.1)
Basophils Relative: 0 %
Eosinophils Absolute: 0 10*3/uL (ref 0.0–0.5)
Eosinophils Relative: 0 %
HCT: 35.2 % — ABNORMAL LOW (ref 39.0–52.0)
Hemoglobin: 12.3 g/dL — ABNORMAL LOW (ref 13.0–17.0)
Immature Granulocytes: 1 %
Lymphocytes Relative: 10 %
Lymphs Abs: 2.3 10*3/uL (ref 0.7–4.0)
MCH: 27.5 pg (ref 26.0–34.0)
MCHC: 34.9 g/dL (ref 30.0–36.0)
MCV: 78.7 fL — ABNORMAL LOW (ref 80.0–100.0)
Monocytes Absolute: 2.1 10*3/uL — ABNORMAL HIGH (ref 0.1–1.0)
Monocytes Relative: 10 %
Neutro Abs: 17.6 10*3/uL — ABNORMAL HIGH (ref 1.7–7.7)
Neutrophils Relative %: 79 %
Platelets: 605 10*3/uL — ABNORMAL HIGH (ref 150–400)
RBC: 4.47 MIL/uL (ref 4.22–5.81)
RDW: 13.6 % (ref 11.5–15.5)
WBC: 22.2 10*3/uL — ABNORMAL HIGH (ref 4.0–10.5)
nRBC: 0 % (ref 0.0–0.2)

## 2021-11-24 LAB — URINALYSIS, ROUTINE W REFLEX MICROSCOPIC
Bilirubin Urine: NEGATIVE
Glucose, UA: NEGATIVE mg/dL
Ketones, ur: 5 mg/dL — AB
Leukocytes,Ua: NEGATIVE
Nitrite: NEGATIVE
Protein, ur: 100 mg/dL — AB
Specific Gravity, Urine: 1.023 (ref 1.005–1.030)
pH: 6 (ref 5.0–8.0)

## 2021-11-24 LAB — APTT: aPTT: 39 seconds — ABNORMAL HIGH (ref 24–36)

## 2021-11-24 LAB — LACTIC ACID, PLASMA
Lactic Acid, Venous: 0.9 mmol/L (ref 0.5–1.9)
Lactic Acid, Venous: 1.1 mmol/L (ref 0.5–1.9)

## 2021-11-24 LAB — RESP PANEL BY RT-PCR (FLU A&B, COVID) ARPGX2
Influenza A by PCR: NEGATIVE
Influenza B by PCR: NEGATIVE
SARS Coronavirus 2 by RT PCR: NEGATIVE

## 2021-11-24 LAB — LIPASE, BLOOD: Lipase: 21 U/L (ref 11–51)

## 2021-11-24 LAB — PROTIME-INR
INR: 1.2 (ref 0.8–1.2)
Prothrombin Time: 15.1 seconds (ref 11.4–15.2)

## 2021-11-24 MED ORDER — SODIUM CHLORIDE 0.9 % IV SOLN
2.0000 g | Freq: Three times a day (TID) | INTRAVENOUS | Status: DC
  Administered 2021-11-24: 2 g via INTRAVENOUS

## 2021-11-24 MED ORDER — MELATONIN 3 MG PO TABS
3.0000 mg | ORAL_TABLET | Freq: Every evening | ORAL | Status: DC | PRN
  Administered 2021-11-25 – 2021-11-29 (×3): 3 mg via ORAL
  Filled 2021-11-24 (×4): qty 1

## 2021-11-24 MED ORDER — PROCHLORPERAZINE EDISYLATE 10 MG/2ML IJ SOLN
10.0000 mg | INTRAMUSCULAR | Status: DC | PRN
  Administered 2021-11-24: 10 mg via INTRAVENOUS
  Filled 2021-11-24: qty 2

## 2021-11-24 MED ORDER — LACTATED RINGERS IV BOLUS (SEPSIS): 500.0000 mL | Freq: Once | INTRAVENOUS | Status: DC

## 2021-11-24 MED ORDER — PANTOPRAZOLE SODIUM 40 MG PO TBEC
40.0000 mg | DELAYED_RELEASE_TABLET | Freq: Every day | ORAL | Status: DC
  Administered 2021-11-25 – 2021-11-30 (×5): 40 mg via ORAL
  Filled 2021-11-24 (×6): qty 1

## 2021-11-24 MED ORDER — LACTATED RINGERS IV BOLUS (SEPSIS)
1000.0000 mL | Freq: Once | INTRAVENOUS | Status: AC
  Administered 2021-11-24: 1000 mL via INTRAVENOUS

## 2021-11-24 MED ORDER — LACTATED RINGERS IV BOLUS (SEPSIS): 1000.0000 mL | Freq: Once | INTRAVENOUS | Status: DC

## 2021-11-24 MED ORDER — PIPERACILLIN-TAZOBACTAM 3.375 G IVPB
3.3750 g | Freq: Three times a day (TID) | INTRAVENOUS | Status: DC
  Administered 2021-11-24 – 2021-11-29 (×14): 3.375 g via INTRAVENOUS
  Filled 2021-11-24 (×14): qty 50

## 2021-11-24 MED ORDER — ONDANSETRON 4 MG PO TBDP: 4.0000 mg | ORAL_TABLET | Freq: Four times a day (QID) | ORAL | Status: DC | PRN

## 2021-11-24 MED ORDER — ONDANSETRON HCL 4 MG/2ML IJ SOLN
4.0000 mg | Freq: Once | INTRAMUSCULAR | Status: AC
  Administered 2021-11-24: 4 mg via INTRAVENOUS
  Filled 2021-11-24: qty 2

## 2021-11-24 MED ORDER — ENOXAPARIN SODIUM 40 MG/0.4ML IJ SOSY
40.0000 mg | PREFILLED_SYRINGE | INTRAMUSCULAR | Status: DC
  Filled 2021-11-24 (×4): qty 0.4

## 2021-11-24 MED ORDER — ACETAMINOPHEN 500 MG PO TABS
1000.0000 mg | ORAL_TABLET | Freq: Four times a day (QID) | ORAL | Status: DC
  Administered 2021-11-24 – 2021-11-30 (×18): 1000 mg via ORAL
  Filled 2021-11-24 (×21): qty 2

## 2021-11-24 MED ORDER — KCL IN DEXTROSE-NACL 20-5-0.9 MEQ/L-%-% IV SOLN
INTRAVENOUS | Status: DC
  Filled 2021-11-24 (×7): qty 1000

## 2021-11-24 MED ORDER — OXYCODONE HCL 5 MG PO TABS
5.0000 mg | ORAL_TABLET | ORAL | Status: DC | PRN
  Administered 2021-11-25: 5 mg via ORAL
  Administered 2021-11-25: 10 mg via ORAL
  Administered 2021-11-25: 5 mg via ORAL
  Administered 2021-11-26 – 2021-11-30 (×13): 10 mg via ORAL
  Filled 2021-11-24 (×8): qty 2
  Filled 2021-11-24: qty 1
  Filled 2021-11-24 (×5): qty 2
  Filled 2021-11-24: qty 1
  Filled 2021-11-24 (×6): qty 2

## 2021-11-24 MED ORDER — KCL IN DEXTROSE-NACL 20-5-0.45 MEQ/L-%-% IV SOLN
INTRAVENOUS | Status: DC
  Filled 2021-11-24: qty 1000

## 2021-11-24 MED ORDER — IOHEXOL 350 MG/ML SOLN
80.0000 mL | Freq: Once | INTRAVENOUS | Status: AC | PRN
  Administered 2021-11-24: 80 mL via INTRAVENOUS

## 2021-11-24 MED ORDER — DIPHENHYDRAMINE HCL 25 MG PO CAPS: 25.0000 mg | ORAL_CAPSULE | Freq: Four times a day (QID) | ORAL | Status: DC | PRN

## 2021-11-24 MED ORDER — LACTATED RINGERS IV SOLN: INTRAVENOUS | Status: DC

## 2021-11-24 MED ORDER — LACTATED RINGERS IV BOLUS (SEPSIS)
1000.0000 mL | Freq: Once | INTRAVENOUS | Status: AC
Start: 2021-11-24 — End: 2021-11-24
  Administered 2021-11-24: 1000 mL via INTRAVENOUS

## 2021-11-24 MED ORDER — SODIUM CHLORIDE 0.9 % IV SOLN
2.0000 g | Freq: Two times a day (BID) | INTRAVENOUS | Status: DC
  Filled 2021-11-24: qty 12.5

## 2021-11-24 MED ORDER — DIPHENHYDRAMINE HCL 50 MG/ML IJ SOLN: 25.0000 mg | Freq: Four times a day (QID) | INTRAMUSCULAR | Status: DC | PRN

## 2021-11-24 MED ORDER — SIMETHICONE 80 MG PO CHEW: 40.0000 mg | CHEWABLE_TABLET | Freq: Four times a day (QID) | ORAL | Status: DC | PRN

## 2021-11-24 MED ORDER — FENTANYL CITRATE PF 50 MCG/ML IJ SOSY
100.0000 ug | PREFILLED_SYRINGE | Freq: Once | INTRAMUSCULAR | Status: AC
  Administered 2021-11-24: 100 ug via INTRAVENOUS
  Filled 2021-11-24: qty 2

## 2021-11-24 MED ORDER — MORPHINE SULFATE (PF) 2 MG/ML IV SOLN
1.0000 mg | INTRAVENOUS | Status: DC | PRN
  Administered 2021-11-24 – 2021-11-25 (×4): 4 mg via INTRAVENOUS
  Filled 2021-11-24 (×4): qty 2

## 2021-11-24 MED ORDER — ONDANSETRON HCL 4 MG/2ML IJ SOLN
4.0000 mg | Freq: Four times a day (QID) | INTRAMUSCULAR | Status: DC | PRN
  Administered 2021-11-24 – 2021-11-26 (×3): 4 mg via INTRAVENOUS
  Filled 2021-11-24 (×3): qty 2

## 2021-11-24 MED ORDER — SODIUM CHLORIDE 0.9 % IV SOLN
2.0000 g | Freq: Once | INTRAVENOUS | Status: DC
Start: 2021-11-24 — End: 2021-11-24

## 2021-11-24 MED ORDER — METRONIDAZOLE 500 MG/100ML IV SOLN
500.0000 mg | Freq: Once | INTRAVENOUS | Status: AC
  Administered 2021-11-24: 500 mg via INTRAVENOUS
  Filled 2021-11-24: qty 100

## 2021-11-24 MED ORDER — METOPROLOL TARTRATE 5 MG/5ML IV SOLN: 5.0000 mg | Freq: Four times a day (QID) | INTRAVENOUS | Status: DC | PRN

## 2021-11-24 NOTE — ED Notes (Signed)
Patient transported to CT 

## 2021-11-24 NOTE — ED Provider Notes (Signed)
?[facility] EMERGENCY DEPARTMENT ?Provider Note ? ? ?CSN: [id number] ?Arrival date & time: 11/24/21  0954 ? ?  ? ?History ? ?Chief Complaint  ?Patient presents with  ? Abdominal Pain  ? Nausea  ? Emesis  ? ? ?Brad Lyons is a 26 y.o. male. ? ?HPI ? ?  ?25 year old male comes in with chief complaint of abdominal pain, nausea, sweats and fevers.  He is status post treatment for perforated appendix, requiring IV antibiotics and IR drainage. ? ?Patient states that 5 days ago he started having abdominal pain.  3 days ago he started noticing that the pain was getting worse and patient started having fevers, sweats, nausea.  He could not sleep because of the pain last night, and decided to come to the ER. ? ?Review of system is also positive for some left-sided chest discomfort.  Patient denies any shortness of breath ?Home Medications ?Prior to Admission medications   ?Medication Sig Start Date End Date Taking? Authorizing Provider  ?acetaminophen (TYLENOL) 500 MG tablet Take 500-1,000 mg by mouth every 6 (six) hours as needed for mild pain or headache (Tooth ache).    [provider]  ?bismuth subsalicylate (PEPTO BISMOL) 262 MG chewable tablet Chew 262-524 mg by mouth as needed for indigestion.    [provider]  ?ondansetron (ZOFRAN) 4 MG tablet Take 1 tablet (4 mg total) by mouth every 8 (eight) hours as needed for nausea or vomiting. 10/21/21   Meccariello, Solmon Ice, DO  ?polyethylene glycol powder (GLYCOLAX/MIRALAX) 17 GM/SCOOP powder Take 17 g by mouth daily as needed for mild constipation.    [provider]  ?   ? ?Allergies    ?Patient has no known allergies.   ? ?Review of Systems   ?Review of Systems  ?All other systems reviewed and are negative. ? ?Physical Exam ?Updated Vital Signs ?BP 128/85   Pulse (!) 101   Temp (!) 100.4 ?F (38 ?C) (Oral)   Resp (!) 28   Ht 5\' 6"  (1.676 m)   Wt 90.7 kg   SpO2 98%   BMI 32.28 kg/m?  ?Physical Exam ?Vitals and nursing note  reviewed.  ?Constitutional:   ?   Appearance: He is well-developed.  ?HENT:  ?   Head: Atraumatic.  ?Cardiovascular:  ?   Rate and Rhythm: Normal rate.  ?Pulmonary:  ?   Effort: Pulmonary effort is normal.  ?Abdominal:  ?   Tenderness: There is abdominal tenderness in the right upper quadrant and epigastric area. There is guarding and rebound. Positive signs include Murphy's sign.  ?Musculoskeletal:  ?   Cervical back: Neck supple.  ?Skin: ?   General: Skin is warm.  ?Neurological:  ?   Mental Status: He is alert and oriented to person, place, and time.  ? ? ?ED Results / Procedures / Treatments   ?Labs ?(all labs ordered are listed, but only abnormal results are displayed) ?Labs Reviewed  ?COMPREHENSIVE METABOLIC PANEL - Abnormal; Notable for the following components:  ?    Result Value  ? Sodium 134 (*)   ? CO2 21 (*)   ? Glucose, Bld 104 (*)   ? Total Protein 8.8 (*)   ? Albumin 3.0 (*)   ? Total Bilirubin 1.4 (*)   ? All other components within normal limits  ?URINALYSIS, ROUTINE W REFLEX MICROSCOPIC - Abnormal; Notable for the following components:  ? Color, Urine AMBER (*)   ? APPearance HAZY (*)   ? Hgb urine dipstick SMALL (*)   ?  Ketones, ur 5 (*)   ? Protein, ur 100 (*)   ? Bacteria, UA FEW (*)   ? All other components within normal limits  ?CBC WITH DIFFERENTIAL/PLATELET - Abnormal; Notable for the following components:  ? WBC 22.2 (*)   ? Hemoglobin 12.3 (*)   ? HCT 35.2 (*)   ? MCV 78.7 (*)   ? Platelets 605 (*)   ? Neutro Abs 17.6 (*)   ? Monocytes Absolute 2.1 (*)   ? Abs Immature Granulocytes 0.12 (*)   ? All other components within normal limits  ?APTT - Abnormal; Notable for the following components:  ? aPTT 39 (*)   ? All other components within normal limits  ?RESP PANEL BY RT-PCR (FLU A&B, COVID) [id number]  ?CULTURE, BLOOD (ROUTINE X 2)  ?CULTURE, BLOOD (ROUTINE X 2)  ?URINE CULTURE  ?LIPASE, BLOOD  ?LACTIC ACID, PLASMA  ?PROTIME-INR  ?LACTIC ACID, PLASMA  ? ? ?EKG ?EKG  Interpretation ? ?Date/Time:  Tuesday Nov 24 2021 11:47:51 EDT ?Ventricular Rate:  116 ?PR Interval:  125 ?QRS Duration: 80 ?QT Interval:  280 ?QTC Calculation: 389 ?R Axis:   46 ?Text Interpretation: Sinus tachycardia Paired ventricular premature complexes Aberrant conduction of SV complex(es) Borderline T abnormalities, anterior leads twi inferior and lateral leads Confirmed by Derwood KaplanNanavati, Darcella Shiffman [phone]([id number]) on 11/24/2021 12:58:49 PM ? ?Radiology ?DG Chest 1 View ? ?Result Date: 11/24/2021 ?CLINICAL DATA:  Provided history: Nausea, vomiting, abdominal pain. EXAM: CHEST  1 VIEW COMPARISON:  Prior chest radiograph 10/30/2021. CT abdomen/pelvis 11/12/2021. FINDINGS: Heart size within normal limits. Persistent small right pleural effusion. Ill-defined opacity at the right lung base. No appreciable airspace consolidation within the left lung. No evidence of pneumothorax. No acute bony abnormality identified. IMPRESSION: Persistent small right pleural effusion. Ill-defined opacity within the right lung base. This has an appearance most suggestive of atelectasis. However, pneumonia is difficult to definitively exclude and clinical correlation is recommended. Electronically Signed   By: Jackey LogeKyle  Golden D.O.   On: 11/24/2021 10:54  ? ?CT ABDOMEN PELVIS W CONTRAST ? ?Result Date: 11/24/2021 ?CLINICAL DATA:  Insert his EXAM: CT ABDOMEN AND PELVIS WITH CONTRAST TECHNIQUE: Multidetector CT imaging of the abdomen and pelvis was performed using the standard protocol following bolus administration of intravenous contrast. RADIATION DOSE REDUCTION: This exam was performed according to the departmental dose-optimization program which includes automated exposure control, adjustment of the mA and/or kV according to patient size and/or use of iterative reconstruction technique. CONTRAST:  80mL OMNIPAQUE IOHEXOL 350 MG/ML SOLN COMPARISON:  CT 11/12/2021 FINDINGS: Lower chest: Small right pleural effusion, slightly decreased from prior, with adjacent  atelectasis. Hepatobiliary: Along the anterior and inferior surface of the left hepatic lobe, there is a rim enhancing collection measuring 5.4 x 3.4 x 7.1 cm with adjacent inflammatory stranding (series 3, image 31, series 7, image 115). This collection also abuts the anterior body of the stomach. This previously measured up to 2.8 x 2.8 x 3.3 cm (remeasured for consistency). There is adjacent hypoattenuation in the liver parenchyma. The gallbladder is contracted with a notable 2.2 cm stone in the gallbladder fundus. No intrahepatic ductal dilation. Pancreas: Unremarkable. No pancreatic ductal dilatation or surrounding inflammatory changes. Spleen: Normal in size without focal abnormality. Adrenals/Urinary Tract: Adrenal glands are unremarkable. No hydronephrosis or nephrolithiasis. Bladder is unremarkable. Stomach/Bowel: No evidence of bowel obstruction. Prior appendectomy.Lower abdominal surgical drain has been removed since the prior exam. No residual fluid collection in the pelvis. Vascular/Lymphatic: No significant vascular findings are present. No enlarged  abdominal or pelvic lymph nodes. Reproductive: Unremarkable. Other: No abdominal wall hernia or abnormality. No abdominopelvic ascites. Removal of prior right gluteal approach drain. Musculoskeletal: Prior right femur fixation, partially visualized. No acute osseous abnormality. IMPRESSION: Anterior epigastric abscess along the anterior and inferior aspect of the left hepatic lobe, measuring 5.4 x 3.4 x 7.1 cm, with adjacent extensive inflammation and abutment of the anterior aspect of the gastric body. Adjacent hypoattenuation in the liver parenchyma, likely reactive. Recent appendectomy with removal of prior lower abdominal surgical drain. No residual fluid collection in the pelvis. Slightly decreased right pleural effusion with adjacent basilar atelectasis in comparison to prior. Electronically Signed   By: Caprice Renshaw M.D.   On: 11/24/2021 13:45  ? ?US  Abdomen Limited RUQ (LIVER/GB) ? ?Result Date: 11/24/2021 ?CLINICAL DATA:  Abdominal pain for 5 days. EXAM: ULTRASOUND ABDOMEN LIMITED RIGHT UPPER QUADRANT COMPARISON:  CT AP 11/12/2021 FINDINGS: Gallbladder: 2.8 cm gallstone identified as

## 2021-11-24 NOTE — H&P (Addendum)
? ? ? ?Brad Lyons ?1996-01-25  ?[id number].   ? ?Chief Complaint/Reason for Consult: abdominal pain, intra-abdominal abscess ? ?HPI:  ?This is a pleasant 26 yo black male with no significant PMH who was just recently admitted secondary to a perforated appendicitis.  He underwent lap appy by Dr. Dossie Der on 4/9 with purulent peritonitis, fecalith, and perforation.  He had a surgical drain placed at the time of surgery.  He developed a post op ileus which resolved, but he also developed a post op pelvic abscess.  This was drained by IR.  His surgical drain was removed prior to DC but his IR drain remained in place.  He was discharged on 4/18.  He returned to the IR clinic on 4/27 with a repeat CT scan showing resolution of this pelvic collection and his drain was pulled.  Incidentally he was noted to have soft tissue stranding in the epigastric region at that time but no other findings or concerns that warranted follow up. ? ?He has been doing well since that time until 2 days ago.  He developed epigastric abdominal pain, N,V, subjective fevers, chest pain, and pain with breathing at times.  He does not recall the last time he had a BM, but states it was after taking a laxative.  He denies diarrhea.  His pain continued to worsen along with his general malaise.  He presented back to the [facility] today where he was noted to be tachycardic, febrile up to 102.9, WBC of 22K, but normotensive, with a CT scan showing evidence of a moderate-sized fluid collection anterior to the gastric body.  We have been asked to see for admission and management. ? ?ROS: ?ROS: Please see HPI, otherwise all other systems have been reviewed and are negative. ? ?Family History  ?Problem Relation Age of Onset  ? Healthy Father   ? ? ?Past Medical History:  ?Diagnosis Date  ? GERD (gastroesophageal reflux disease)   ? ? ?Past Surgical History:  ?Procedure Laterality Date  ? IR RADIOLOGIST EVAL & MGMT  11/12/2021  ? LAPAROSCOPIC APPENDECTOMY   10/25/2021  ? Procedure: APPENDECTOMY LAPAROSCOPIC;  Surgeon: Quentin Ore, MD;  Location: [facility] OR;  Service: General;;  ? LAPAROSCOPY N/A 10/25/2021  ? Procedure: LAPAROSCOPY DIAGNOSTIC;  Surgeon: Quentin Ore, MD;  Location: [facility] OR;  Service: General;  Laterality: N/A;  ? ? ?Social History:  reports that he has never smoked. He has never used smokeless tobacco. He reports current alcohol use. He reports current drug use. Drug: Marijuana. ? ?Allergies: No Known Allergies ? ?(Not in a hospital admission) ? ? ? ?Physical Exam: ?Blood pressure 128/85, pulse (!) 101, temperature (!) 100.4 ?F (38 ?C), temperature source Oral, resp. rate (!) 28, height 5\' 6"  (1.676 m), weight 90.7 kg, SpO2 98 %. ?General: pleasant, WD, WN black male who is laying in bed in mild distress secondary to pain in his abdomen ?HEENT: head is normocephalic, atraumatic.  Sclera are noninjected.  PERRL.  Ears and nose without any masses or lesions.  Mouth is pink and dry ?Heart: regular rhythm, but tachycardic.  Normal s1,s2. No obvious murmurs, gallops, or rubs noted.  Palpable radial and pedal pulses bilaterally ?Lungs: CTAB, no wheezes, rhonchi, or rales noted.  Respiratory effort nonlabored, but he is mildly tachypneic at times ?Abd: soft, very tender in epigastric and RUQ, greatest in epigastrium, voluntary guarding.  Not really tender across lower abdomen, mild distention across upper abdomen, hypoactive BS, no masses, hernias, or organomegaly.  Incisions are  well healed ?MS: all 4 extremities are symmetrical with no cyanosis, clubbing, or edema. ?GU: yellow urine noted in urinal at bedside ?Skin: warm and dry with no masses, lesions, or rashes ?Neuro: Cranial nerves 2-12 grossly intact, sensation is normal throughout ?Psych: A&Ox3 with an appropriate affect. ? ? ?Results for orders placed or performed during the hospital encounter of 11/24/21 (from the past 48 hour(s))  ?Resp Panel by RT-PCR (Flu A&B, Covid) Nasopharyngeal Swab      Status: None  ? Collection Time: 11/24/21 10:21 AM  ? Specimen: Nasopharyngeal Swab; Nasopharyngeal(NP) swabs in vial transport medium  ?Result Value Ref Range  ? SARS Coronavirus 2 by RT PCR NEGATIVE NEGATIVE  ?  Comment: (NOTE) ?SARS-CoV-2 target nucleic acids are NOT DETECTED. ? ?The SARS-CoV-2 RNA is generally detectable in upper respiratory ?specimens during the acute phase of infection. The lowest ?concentration of SARS-CoV-2 viral copies this assay can detect is ?138 copies/mL. A negative result does not preclude SARS-Cov-2 ?infection and should not be used as the sole basis for treatment or ?other patient management decisions. A negative result may occur with  ?improper specimen collection/handling, submission of specimen other ?than nasopharyngeal swab, presence of viral mutation(s) within the ?areas targeted by this assay, and inadequate number of viral ?copies(<138 copies/mL). A negative result must be combined with ?clinical observations, patient history, and epidemiological ?information. The expected result is Negative. ? ?Fact Sheet for Patients:  ?BloggerCourse.comhttps://www.fda.gov/media/152166/download ? ?Fact Sheet for Healthcare Providers:  ?SeriousBroker.ithttps://www.fda.gov/media/152162/download ? ?This test is no t yet approved or cleared by the [place] FDA and  ?has been authorized for detection and/or diagnosis of SARS-CoV-2 by ?FDA under an Emergency Use Authorization (EUA). This EUA will remain  ?in effect (meaning this test can be used) for the duration of the ?COVID-19 declaration under Section 564(b)(1) of the Act, 21 ?U.S.C.section 360bbb-3(b)(1), unless the authorization is terminated  ?or revoked sooner.  ? ? ?  ? Influenza A by PCR NEGATIVE NEGATIVE  ? Influenza B by PCR NEGATIVE NEGATIVE  ?  Comment: (NOTE) ?The Xpert Xpress SARS-CoV-2/FLU/RSV plus assay is intended as an aid ?in the diagnosis of influenza from Nasopharyngeal swab specimens and ?should not be used as a sole basis for treatment. Nasal  washings and ?aspirates are unacceptable for Xpert Xpress SARS-CoV-2/FLU/RSV ?testing. ? ?Fact Sheet for Patients: ?BloggerCourse.comhttps://www.fda.gov/media/152166/download ? ?Fact Sheet for Healthcare Providers: ?SeriousBroker.ithttps://www.fda.gov/media/152162/download ? ?This test is not yet approved or cleared by the [place] FDA and ?has been authorized for detection and/or diagnosis of SARS-CoV-2 by ?FDA under an Emergency Use Authorization (EUA). This EUA will remain ?in effect (meaning this test can be used) for the duration of the ?COVID-19 declaration under Section 564(b)(1) of the Act, 21 U.S.C. ?section 360bbb-3(b)(1), unless the authorization is terminated or ?revoked. ? ?Performed at [facility], [address] [place], [place] ?[id number] ?  ?Lipase, blood     Status: None  ? Collection Time: 11/24/21 10:29 AM  ?Result Value Ref Range  ? Lipase 21 11 - 51 U/L  ?  Comment: Performed at [facility], [address] [place], [place] [address]  ?Comprehensive metabolic panel     Status: Abnormal  ? Collection Time: 11/24/21 10:29 AM  ?Result Value Ref Range  ? Sodium 134 (L) 135 - 145 mmol/L  ? Potassium 3.8 3.5 - 5.1 mmol/L  ? Chloride 99 98 - 111 mmol/L  ? CO2 21 (L) 22 - 32 mmol/L  ? Glucose, Bld 104 (H) 70 - 99  mg/dL  ?  Comment: Glucose reference range applies only to samples taken after fasting for at least 8 hours.  ? BUN 6 6 - 20 mg/dL  ? Creatinine, Ser 1.07 0.61 - 1.24 mg/dL  ? Calcium 9.6 8.9 - 10.3 mg/dL  ? Total Protein 8.8 (H) 6.5 - 8.1 g/dL  ? Albumin 3.0 (L) 3.5 - 5.0 g/dL  ? AST 35 15 - 41 U/L  ? ALT 41 0 - 44 U/L  ? Alkaline Phosphatase 121 38 - 126 U/L  ? Total Bilirubin 1.4 (H) 0.3 - 1.2 mg/dL  ? GFR, Estimated >60 >60 mL/min  ?  Comment: (NOTE) ?Calculated using the CKD-EPI Creatinine Equation (2021) ?  ? Anion gap 14 5 - 15  ?  Comment: Performed at [facility], [address] [place], [place] [address]  ?Lactic acid, plasma     Status: None  ? Collection Time: 11/24/21 10:29 AM  ?Result  Value Ref Range  ? Lactic Acid, Venous 0.9 0.5 - 1.9 mmol/L  ?  Comment: Performed at  [facility], [address], [place], [place] [address]  ?CBC with Differential     Status: Abnormal  ? Collec

## 2021-11-24 NOTE — ED Triage Notes (Addendum)
Pt/ stated, I started having N/V, stomach pain for 5 days. Unable to keep anything down.  ?

## 2021-11-24 NOTE — ED Provider Triage Note (Signed)
Emergency Medicine Provider Triage Evaluation Note ? ?Brad Lyons , a 26 y.o. male  was evaluated in triage.  Pt complains of nausea, vomiting, abdominal pain.  Patient states that for the past 5 days he has had severe abdominal pain when trying to consume any liquid or food.  He has had nausea and is actively vomiting at this time.  Patient states that he had had some constipation which was relieved with a laxative last night.  In early April the patient presented to [facility] with abdominal pain and was found to have a perforated appendicitis.  Patient had laparoscopic appendectomy and purulent peritonitis was noted.  Repeat CT scan on April 14 showed fluid collection in pelvis concerning for abscess and a drain was placed.  Drain had purulent drainage and drain was left in place on discharge.  Patient was discharged with surgical drain and IR drain.  IR drain was removed on April 27.  Patient states that he missed his appointment with surgery and was told he cannot be seen again for another month and patient removed his surgical drain on his own. ? ?Review of Systems  ?Positive: Fever, nausea, vomiting, abdominal pain ?Negative: Chest pain, shortness of breath ? ?Physical Exam  ?BP 123/78 (BP Location: Left Arm)   Pulse (!) 131   Temp (!) 102.9 ?F (39.4 ?C) (Oral)   Resp 18   SpO2 95%  ?Gen:   Awake, toxic-appearing ?Resp:  Normal effort  ?MSK:   Moves extremities without difficulty  ?Other:  Patient actively vomiting ? ?Medical Decision Making  ?Medically screening exam initiated at 10:23 AM.  Appropriate orders placed.  Shaul Trautman was informed that the remainder of the evaluation will be completed by another provider, this initial triage assessment does not replace that evaluation, and the importance of remaining in the ED until their evaluation is complete. ? ? ?  ?Darrick Grinder, PA-C ?11/24/21 1026 ? ?

## 2021-11-24 NOTE — ED Notes (Signed)
US at bedside

## 2021-11-24 NOTE — Progress Notes (Signed)
Pharmacy Antibiotic Note ? ?Brad Lyons is a 26 y.o. male admitted on 11/24/2021 with  intra-abdominal infection .  Pharmacy has been consulted for cefepime dosing. ? ?Plan: ?Start Cefepime 2g IV q8h  ?Monitor CBC, renal function, temp, and s/sx of infection ?F/u cultures and de-escalate antibiotics as able ? ?Height: 5\' 6"  (167.6 cm) ?Weight: 90.7 kg (200 lb) ?IBW/kg (Calculated) : 63.8 ? ?Temp (24hrs), Avg:101.7 ?F (38.7 ?C), Min:100.4 ?F (38 ?C), Max:102.9 ?F (39.4 ?C) ? ?Recent Labs  ?Lab 11/24/21 ?1029  ?WBC 22.2*  ?CREATININE 1.07  ?LATICACIDVEN 0.9  ?  ?Estimated Creatinine Clearance: 108.5 mL/min (by C-G formula based on SCr of 1.07 mg/dL).   ? ?No Known Allergies ? ?Antimicrobials this admission: ?Cefepime 5/9 >>  ?Metronidazole 5/9 >>  ? ?Dose adjustments this admission: ?N/A ? ?Microbiology results: ?5/9 BCx: to be collected ?5/9 UCx: to be collected  ? ?Thank you for allowing pharmacy to be a part of this patient?s care. ? ?7/9 ?11/24/2021 11:42 AM ? ?

## 2021-11-24 NOTE — Sepsis Progress Note (Signed)
Sepsis protocol monitored by eLink 

## 2021-11-25 ENCOUNTER — Inpatient Hospital Stay (HOSPITAL_COMMUNITY): Payer: Self-pay

## 2021-11-25 ENCOUNTER — Encounter (HOSPITAL_COMMUNITY): Payer: Self-pay

## 2021-11-25 LAB — CBC
HCT: 30.3 % — ABNORMAL LOW (ref 39.0–52.0)
Hemoglobin: 10.6 g/dL — ABNORMAL LOW (ref 13.0–17.0)
MCH: 27.5 pg (ref 26.0–34.0)
MCHC: 35 g/dL (ref 30.0–36.0)
MCV: 78.7 fL — ABNORMAL LOW (ref 80.0–100.0)
Platelets: 514 10*3/uL — ABNORMAL HIGH (ref 150–400)
RBC: 3.85 MIL/uL — ABNORMAL LOW (ref 4.22–5.81)
RDW: 13.7 % (ref 11.5–15.5)
WBC: 18.1 10*3/uL — ABNORMAL HIGH (ref 4.0–10.5)
nRBC: 0 % (ref 0.0–0.2)

## 2021-11-25 LAB — COMPREHENSIVE METABOLIC PANEL
ALT: 41 U/L (ref 0–44)
AST: 30 U/L (ref 15–41)
Albumin: 2.4 g/dL — ABNORMAL LOW (ref 3.5–5.0)
Alkaline Phosphatase: 109 U/L (ref 38–126)
Anion gap: 8 (ref 5–15)
BUN: 5 mg/dL — ABNORMAL LOW (ref 6–20)
CO2: 27 mmol/L (ref 22–32)
Calcium: 8.8 mg/dL — ABNORMAL LOW (ref 8.9–10.3)
Chloride: 101 mmol/L (ref 98–111)
Creatinine, Ser: 1.14 mg/dL (ref 0.61–1.24)
GFR, Estimated: >60 mL/min (ref >60)
Glucose, Bld: 124 mg/dL — ABNORMAL HIGH (ref 70–99)
Potassium: 3.7 mmol/L (ref 3.5–5.1)
Sodium: 136 mmol/L (ref 135–145)
Total Bilirubin: 1.1 mg/dL (ref 0.3–1.2)
Total Protein: 7.2 g/dL (ref 6.5–8.1)

## 2021-11-25 LAB — URINE CULTURE

## 2021-11-25 MED ORDER — MIDAZOLAM HCL 2 MG/2ML IJ SOLN
INTRAMUSCULAR | Status: AC
  Filled 2021-11-25: qty 2

## 2021-11-25 MED ORDER — METHOCARBAMOL 1000 MG/10ML IJ SOLN
1000.0000 mg | Freq: Three times a day (TID) | INTRAVENOUS | Status: DC | PRN
  Filled 2021-11-25: qty 10

## 2021-11-25 MED ORDER — HYDROMORPHONE HCL 1 MG/ML IJ SOLN
0.5000 mg | INTRAMUSCULAR | Status: DC | PRN
  Administered 2021-11-25 – 2021-11-29 (×9): 1 mg via INTRAVENOUS
  Filled 2021-11-25 (×11): qty 1

## 2021-11-25 MED ORDER — SODIUM CHLORIDE 0.9% FLUSH
5.0000 mL | Freq: Three times a day (TID) | INTRAVENOUS | Status: DC
  Administered 2021-11-25 – 2021-11-29 (×11): 5 mL

## 2021-11-25 MED ORDER — MIDAZOLAM HCL 2 MG/2ML IJ SOLN
INTRAMUSCULAR | Status: AC | PRN
  Administered 2021-11-25 (×5): 1 mg via INTRAVENOUS

## 2021-11-25 MED ORDER — LIDOCAINE HCL 1 % IJ SOLN
INTRAMUSCULAR | Status: AC
  Filled 2021-11-25: qty 10

## 2021-11-25 MED ORDER — FENTANYL CITRATE (PF) 100 MCG/2ML IJ SOLN
INTRAMUSCULAR | Status: AC | PRN
  Administered 2021-11-25 (×3): 50 ug via INTRAVENOUS

## 2021-11-25 MED ORDER — FENTANYL CITRATE (PF) 100 MCG/2ML IJ SOLN
INTRAMUSCULAR | Status: AC
  Filled 2021-11-25: qty 4

## 2021-11-25 MED ORDER — MIDAZOLAM HCL 2 MG/2ML IJ SOLN
INTRAMUSCULAR | Status: AC
  Filled 2021-11-25: qty 6

## 2021-11-25 NOTE — TOC Initial Note (Signed)
Transition of Care (TOC) - Initial/Assessment Note  ? ? ?Patient Details  ?Name: Brad Lyons ?MRN: [id number] ?Date of Birth: [birth year] ? ?Transition of Care (TOC) CM/SW Contact:    ?Kingsley Plan, RN ?Phone Number: ?11/25/2021, 2:37 PM ? ?Clinical Narrative:                 ? ?Confirmed face sheet information with patient.  ? ?Patient does not have a PCP. Agreeable to a [facility]. Scheduled follow up appointment at [facility] first available December 28, 2021 at 2:30 pm. Information placed on AVS.  ? ? ?Changed pharmacy to [facility]. At discharge [facility] pharmacy will call patient with cost.  ? ?Patient can use pharmacy at [facility] for prescriptions post discharge.  ? ?Sent financial counselor consult.  ? ? ?Bedside nurse will provide drain education to patient prior to discharge  ?Expected Discharge Plan: Home/Self Care ?Barriers to Discharge: Continued Medical Work up ? ? ?Patient Goals and CMS Choice ?Patient states their goals for this hospitalization and ongoing recovery are:: to return to home ?CMS Medicare.gov Compare Post Acute Care list provided to:: Patient ?  ? ?Expected Discharge Plan and Services ?Expected Discharge Plan: Home/Self Care ?In-house Referral: Financial Counselor ?Discharge Planning Services: CM Consult, [facility], Medication Assistance ?  ?Living arrangements for the past 2 months: Single Family Home ?                ?DME Arranged: N/A ?  ?  ?  ?  ?HH Arranged: NA ?  ?  ?  ?  ? ?Prior Living Arrangements/Services ?Living arrangements for the past 2 months: Single Family Home ?  ?Patient language and need for interpreter reviewed:: Yes ?Do you feel safe going back to the place where you live?: Yes      ?Need for Family Participation in Patient Care: Yes (Comment) ?Care giver support system in place?: Yes (comment) ?  ?Criminal Activity/Legal Involvement Pertinent to Current Situation/Hospitalization: No - Comment as  needed ? ?Activities of Daily Living ?Home Assistive Devices/Equipment: None ?ADL Screening (condition at time of admission) ?Patient's cognitive ability adequate to safely complete daily activities?: Yes ?Is the patient deaf or have difficulty hearing?: No ?Does the patient have difficulty seeing, even when wearing glasses/contacts?: No ?Does the patient have difficulty concentrating, remembering, or making decisions?: No ?Patient able to express need for assistance with ADLs?: Yes ?Does the patient have difficulty dressing or bathing?: No ?Independently performs ADLs?: Yes (appropriate for developmental age) ?Does the patient have difficulty walking or climbing stairs?: No ?Weakness of Legs: None ?Weakness of Arms/Hands: None ? ?Permission Sought/Granted ?  ?Permission granted to share information with : Yes, Verbal Permission Granted ? Share Information with NAME: Bennie Dallas mother ?   ?   ?   ? ?Emotional Assessment ?Appearance:: Appears stated age ?Attitude/Demeanor/Rapport: Engaged ?Affect (typically observed): Accepting ?Orientation: : Oriented to Place, Oriented to  Time, Oriented to Situation, Oriented to Self ?Alcohol / Substance Use: Not Applicable ?Psych Involvement: No (comment) ? ?Admission diagnosis:  Abscess [L02.91] ?Intra-abdominal fluid collection [R18.8] ?Calculus of gallbladder without cholecystitis without obstruction [K80.20] ?Patient Active Problem List  ? Diagnosis Date Noted  ? Intra-abdominal fluid collection 11/24/2021  ? Acute perforated appendicitis 10/25/2021  ? ?PCP:  Pcp, No ?Pharmacy:   ?[facility] [phone] - [place], [place] - [phone] AT [facility] [address] ?[address] ?[place] [address] ?Phone: [phone] Fax: [phone] ? ?[facility] ?[address] ?[place] [address] ?Phone: [phone] Fax: [phone] ? ? ? ? ?Social Determinants of Health (SDOH) Interventions ?  ? ?Readmission Risk  Interventions ?   ? View : No data to display.  ?  ?  ?  ? ? ? ?

## 2021-11-25 NOTE — Procedures (Signed)
Interventional Radiology Procedure Note ? ?Procedure: CT Guided Drainage of abdominal abscess ? ?Complications: None ? ?Estimated Blood Loss: < 10 mL ? ?Findings: ?10 Fr drain placed in anterior epigastric abdominal abscess with return of purulent fluid. Fluid sample sent for culture analysis. Drain attached to suction bulb drainage. ? ?Will follow. ? ?Sherrine Maples T. Fredia Sorrow, M.D ?Pager:  [phone] ? ?  ?

## 2021-11-25 NOTE — Consult Note (Signed)
? ?Chief Complaint: ?Patient was seen in consultation today for intra-abdominal fluid collection ? ?Referring Physician(s): ?Dr. Maisie Fus Cornett ? ?Supervising Physician: Irish Lack ? ?Patient Status: [facility] - In-pt ? ?History of Present Illness: ?Brad Lyons is a 26 y.o. male with no significant past medical history who recently underwent lap appendectomy 10/25/21 at which time he was found to have purulent peritonitis, fecalith, and perforation. He did require percutaneous drainage of a post-operative pelvic abscess 11/01/21 which was ultimately removed after resolve of fluid collection 11/12/21.  He now returns to [facility] ED with nausea, vomiting, abdominal pain.  ?WBC 18K, Tmax 101.4 ? ?CT Abdomen Pelvis: ?Anterior epigastric abscess along the anterior and inferior aspect ?of the left hepatic lobe, measuring 5.4 x 3.4 x 7.1 cm, with ?adjacent extensive inflammation and abutment of the anterior aspect ?of the gastric body. Adjacent hypoattenuation in the liver ?parenchyma, likely reactive. ? ?Case reviewed and approved by Dr. Fredia Sorrow for new epigastric drain placement.  ? ?Patient assessed at bedside. He is understanding of the goals of the procedure and is agreeable to proceed.  ?He has been NPO today.  ? ?Past Medical History:  ?Diagnosis Date  ? GERD (gastroesophageal reflux disease)   ? ? ?Past Surgical History:  ?Procedure Laterality Date  ? IR RADIOLOGIST EVAL & MGMT  11/12/2021  ? LAPAROSCOPIC APPENDECTOMY  10/25/2021  ? Procedure: APPENDECTOMY LAPAROSCOPIC;  Surgeon: Quentin Ore, MD;  Location: [facility] OR;  Service: General;;  ? LAPAROSCOPY N/A 10/25/2021  ? Procedure: LAPAROSCOPY DIAGNOSTIC;  Surgeon: Quentin Ore, MD;  Location: [facility] OR;  Service: General;  Laterality: N/A;  ? ? ?Allergies: ?Patient has no known allergies. ? ?Medications: ?Prior to Admission medications   ?Medication Sig Start Date End Date Taking? Authorizing Provider  ?acetaminophen (TYLENOL) 500 MG tablet Take 500-1,000 mg by  mouth every 6 (six) hours as needed for mild pain or headache (Tooth ache).   Yes [provider]  ?bismuth subsalicylate (PEPTO BISMOL) 262 MG chewable tablet Chew 262-524 mg by mouth as needed for indigestion.   Yes [provider]  ?ondansetron (ZOFRAN) 4 MG tablet Take 1 tablet (4 mg total) by mouth every 8 (eight) hours as needed for nausea or vomiting. 10/21/21  Yes Meccariello, Solmon Ice, DO  ?polyethylene glycol powder (GLYCOLAX/MIRALAX) 17 GM/SCOOP powder Take 17 g by mouth daily as needed for mild constipation.   Yes [provider]  ?  ? ?Family History  ?Problem Relation Age of Onset  ? Healthy Father   ? ? ?Social History  ? ?Socioeconomic History  ? Marital status: Single  ?  Spouse name: Not on file  ? Number of children: Not on file  ? Years of education: Not on file  ? Highest education level: Not on file  ?Occupational History  ? Not on file  ?Tobacco Use  ? Smoking status: Never  ? Smokeless tobacco: Never  ?Vaping Use  ? Vaping Use: Every day  ?Substance and Sexual Activity  ? Alcohol use: Yes  ?  Comment: occasionally  ? Drug use: Yes  ?  Types: Marijuana  ? Sexual activity: Not on file  ?Other Topics Concern  ? Not on file  ?Social History Narrative  ? 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 ?Social Determinants of Health  ? ?Financial Resource Strain: Not on file  ?Food Insecurity: Not on file  ?Transportation Needs: Not on file  ?Physical Activity: Not on file  ?Stress: Not on file  ?Social Connections:  Not on file  ? ? ? ?Review of Systems: A 12 point ROS discussed and pertinent positives are indicated in the HPI above.  All other systems are negative. ? ?Review of Systems  ?Constitutional:  Negative for fatigue and fever.  ?Respiratory:  Negative for cough and shortness of breath.   ?Cardiovascular:  Negative for chest pain.  ?Gastrointestinal:  Negative for abdominal pain, nausea and vomiting.  ?Musculoskeletal:  Negative for back pain.   ?Psychiatric/Behavioral:  Negative for behavioral problems and confusion.   ? ?Vital Signs: ?BP 115/70 (BP Location: Left Arm)   Pulse 94   Temp 98.8 ?F (37.1 ?C) (Oral)   Resp 19   Ht 5\' 6"  (1.676 m)   Wt 200 lb (90.7 kg)   SpO2 95%   BMI 32.28 kg/m?  ? ?Physical Exam ?Vitals and nursing note reviewed.  ?Constitutional:   ?   General: He is not in acute distress. ?   Appearance: He is well-developed. He is not ill-appearing.  ?Cardiovascular:  ?   Rate and Rhythm: Normal rate and regular rhythm.  ?Pulmonary:  ?   Effort: Pulmonary effort is normal.  ?   Breath sounds: Normal breath sounds.  ?Abdominal:  ?   Tenderness: There is abdominal tenderness in the epigastric area.  ?Skin: ?   General: Skin is warm and dry.  ?Neurological:  ?   General: No focal deficit present.  ?   Mental Status: He is alert and oriented to person, place, and time.  ?Psychiatric:     ?   Mood and Affect: Mood normal.     ?   Behavior: Behavior normal.  ? ? ? ?MD Evaluation ?Airway: WNL ?Heart: WNL ?Abdomen: WNL ?Chest/ Lungs: WNL ?ASA  Classification: 3 ?Mallampati/Airway Score: Two ? ? ?Imaging: ?DG Chest 1 View ? ?Result Date: 11/24/2021 ?CLINICAL DATA:  Provided history: Nausea, vomiting, abdominal pain. EXAM: CHEST  1 VIEW COMPARISON:  Prior chest radiograph 10/30/2021. CT abdomen/pelvis 11/12/2021. FINDINGS: Heart size within normal limits. Persistent small right pleural effusion. Ill-defined opacity at the right lung base. No appreciable airspace consolidation within the left lung. No evidence of pneumothorax. No acute bony abnormality identified. IMPRESSION: Persistent small right pleural effusion. Ill-defined opacity within the right lung base. This has an appearance most suggestive of atelectasis. However, pneumonia is difficult to definitively exclude and clinical correlation is recommended. Electronically Signed   By: Jackey LogeKyle  Golden D.O.   On: 11/24/2021 10:54  ? ?CT ABDOMEN PELVIS W CONTRAST ? ?Result Date:  11/24/2021 ?CLINICAL DATA:  Insert his EXAM: CT ABDOMEN AND PELVIS WITH CONTRAST TECHNIQUE: Multidetector CT imaging of the abdomen and pelvis was performed using the standard protocol following bolus administration of intravenous contrast. RADIATION DOSE REDUCTION: This exam was performed according to the departmental dose-optimization program which includes automated exposure control, adjustment of the mA and/or kV according to patient size and/or use of iterative reconstruction technique. CONTRAST:  80mL OMNIPAQUE IOHEXOL 350 MG/ML SOLN COMPARISON:  CT 11/12/2021 FINDINGS: Lower chest: Small right pleural effusion, slightly decreased from prior, with adjacent atelectasis. Hepatobiliary: Along the anterior and inferior surface of the left hepatic lobe, there is a rim enhancing collection measuring 5.4 x 3.4 x 7.1 cm with adjacent inflammatory stranding (series 3, image 31, series 7, image 115). This collection also abuts the anterior body of the stomach. This previously measured up to 2.8 x 2.8 x 3.3 cm (remeasured for consistency). There is adjacent hypoattenuation in the liver parenchyma. The gallbladder is contracted with a  notable 2.2 cm stone in the gallbladder fundus. No intrahepatic ductal dilation. Pancreas: Unremarkable. No pancreatic ductal dilatation or surrounding inflammatory changes. Spleen: Normal in size without focal abnormality. Adrenals/Urinary Tract: Adrenal glands are unremarkable. No hydronephrosis or nephrolithiasis. Bladder is unremarkable. Stomach/Bowel: No evidence of bowel obstruction. Prior appendectomy.Lower abdominal surgical drain has been removed since the prior exam. No residual fluid collection in the pelvis. Vascular/Lymphatic: No significant vascular findings are present. No enlarged abdominal or pelvic lymph nodes. Reproductive: Unremarkable. Other: No abdominal wall hernia or abnormality. No abdominopelvic ascites. Removal of prior right gluteal approach drain. Musculoskeletal:  Prior right femur fixation, partially visualized. No acute osseous abnormality. IMPRESSION: Anterior epigastric abscess along the anterior and inferior aspect of the left hepatic lobe, measuring 5.4 x 3.4 x 7.1 cm, with adjacent extensive infl

## 2021-11-25 NOTE — Progress Notes (Signed)
? ?Subjective/Chief Complaint: ? ?Pt feels better some epigastric pain  ? ?Objective: ?Vital signs in last 24 hours: ?Temp:  [98.1 ?F (36.7 ?C)-101.4 ?F (38.6 ?C)] 98.8 ?F (37.1 ?C) (05/10 0932) ?Pulse Rate:  [92-131] 94 (05/10 0837) ?Resp:  [16-31] 19 (05/10 0837) ?BP: (104-155)/(57-97) 115/70 (05/10 0837) ?SpO2:  [94 %-100 %] 95 % (05/10 0837) ?Weight:  [90.7 kg] 90.7 kg (05/09 1100) ?Last BM Date : 11/22/21 ? ?Intake/Output from previous day: ?05/09 0701 - 05/10 0700 ?In: 325.4 [I.V.:258.1; IV Piggyback:67.3] ?Out: -  ?Intake/Output this shift: ?No intake/output data recorded. ? ?General appearance: alert and cooperative ?Resp: clear to auscultation bilaterally ?Cardio: NSR ?Incision/Wound:tenderness epigastrium port sites healed  ? ?Lab Results:  ?Recent Labs  ?  11/24/21 ?1029 11/25/21 ?0213  ?WBC 22.2* 18.1*  ?HGB 12.3* 10.6*  ?HCT 35.2* 30.3*  ?PLT 605* 514*  ? ?BMET ?Recent Labs  ?  11/24/21 ?1029 11/25/21 ?0213  ?NA 134* 136  ?K 3.8 3.7  ?CL 99 101  ?CO2 21* 27  ?GLUCOSE 104* 124*  ?BUN 6 5*  ?CREATININE 1.07 1.14  ?CALCIUM 9.6 8.8*  ? ?PT/INR ?Recent Labs  ?  11/24/21 ?1029  ?LABPROT 15.1  ?INR 1.2  ? ?ABG ?No results for input(s): PHART, HCO3 in the last 72 hours. ? ?Invalid input(s): PCO2, PO2 ? ?Studies/Results: ?DG Chest 1 View ? ?Result Date: 11/24/2021 ?CLINICAL DATA:  Provided history: Nausea, vomiting, abdominal pain. EXAM: CHEST  1 VIEW COMPARISON:  Prior chest radiograph 10/30/2021. CT abdomen/pelvis 11/12/2021. FINDINGS: Heart size within normal limits. Persistent small right pleural effusion. Ill-defined opacity at the right lung base. No appreciable airspace consolidation within the left lung. No evidence of pneumothorax. No acute bony abnormality identified. IMPRESSION: Persistent small right pleural effusion. Ill-defined opacity within the right lung base. This has an appearance most suggestive of atelectasis. However, pneumonia is difficult to definitively exclude and clinical correlation  is recommended. Electronically Signed   By: Jackey Loge D.O.   On: 11/24/2021 10:54  ? ?CT ABDOMEN PELVIS W CONTRAST ? ?Result Date: 11/24/2021 ?CLINICAL DATA:  Insert his EXAM: CT ABDOMEN AND PELVIS WITH CONTRAST TECHNIQUE: Multidetector CT imaging of the abdomen and pelvis was performed using the standard protocol following bolus administration of intravenous contrast. RADIATION DOSE REDUCTION: This exam was performed according to the departmental dose-optimization program which includes automated exposure control, adjustment of the mA and/or kV according to patient size and/or use of iterative reconstruction technique. CONTRAST:  44mL OMNIPAQUE IOHEXOL 350 MG/ML SOLN COMPARISON:  CT 11/12/2021 FINDINGS: Lower chest: Small right pleural effusion, slightly decreased from prior, with adjacent atelectasis. Hepatobiliary: Along the anterior and inferior surface of the left hepatic lobe, there is a rim enhancing collection measuring 5.4 x 3.4 x 7.1 cm with adjacent inflammatory stranding (series 3, image 31, series 7, image 115). This collection also abuts the anterior body of the stomach. This previously measured up to 2.8 x 2.8 x 3.3 cm (remeasured for consistency). There is adjacent hypoattenuation in the liver parenchyma. The gallbladder is contracted with a notable 2.2 cm stone in the gallbladder fundus. No intrahepatic ductal dilation. Pancreas: Unremarkable. No pancreatic ductal dilatation or surrounding inflammatory changes. Spleen: Normal in size without focal abnormality. Adrenals/Urinary Tract: Adrenal glands are unremarkable. No hydronephrosis or nephrolithiasis. Bladder is unremarkable. Stomach/Bowel: No evidence of bowel obstruction. Prior appendectomy.Lower abdominal surgical drain has been removed since the prior exam. No residual fluid collection in the pelvis. Vascular/Lymphatic: No significant vascular findings are present. No enlarged abdominal or pelvic  lymph nodes. Reproductive: Unremarkable.  Other: No abdominal wall hernia or abnormality. No abdominopelvic ascites. Removal of prior right gluteal approach drain. Musculoskeletal: Prior right femur fixation, partially visualized. No acute osseous abnormality. IMPRESSION: Anterior epigastric abscess along the anterior and inferior aspect of the left hepatic lobe, measuring 5.4 x 3.4 x 7.1 cm, with adjacent extensive inflammation and abutment of the anterior aspect of the gastric body. Adjacent hypoattenuation in the liver parenchyma, likely reactive. Recent appendectomy with removal of prior lower abdominal surgical drain. No residual fluid collection in the pelvis. Slightly decreased right pleural effusion with adjacent basilar atelectasis in comparison to prior. Electronically Signed   By: Caprice RenshawJacob  Kahn M.D.   On: 11/24/2021 13:45  ? ?US Abdomen Limited RUQ (LIVER/GB) ? ?Result Date: 11/24/2021 ?CLINICAL DATA:  Abdominal pain for 5 days. EXAM: ULTRASOUND ABDOMEN LIMITED RIGHT UPPER QUADRANT COMPARISON:  CT AP 11/12/2021 FINDINGS: Gallbladder: 2.8 cm gallstone identified as noted on recent CT. The gallbladder appears partially decompressed. Nonspecific gallbladder wall thickening measures 4.8 mm. No pericholecystic fluid or sonographic Murphy's sign. Common bile duct: Diameter: 3.5 mm Liver: Complex mass like area within the left lobe of liver corresponding to area of inflammation noted on recent CT from 11/12/2025. This measures 5.1 x 5.6 x 2.8 cm. In the setting of recent perforated appendicitis underlying liver abscess cannot be excluded. Within normal limits in parenchymal echogenicity. Portal vein is patent on color Doppler imaging with normal direction of blood flow towards the liver. Other: None. IMPRESSION: 1. Gallstone. Partially decompressed gallbladder with nonspecific gallbladder wall thickening. No pericholecystic fluid or sonographic Murphy's sign noted. 2. Complex mass like area within the left lobe of liver corresponding to area of inflammation  noted on recent CT from 11/12/2021. Cannot rule out underlying liver abscess. Recommend further evaluation with contrast enhanced CT of the abdomen pelvis with oral contrast material. Electronically Signed   By: Signa Kellaylor  Stroud M.D.   On: 11/24/2021 12:34   ? ?Anti-infectives: ?Anti-infectives (From admission, onward)  ? ? Start     Dose/Rate Route Frequency Ordered Stop  ? 11/24/21 1545  piperacillin-tazobactam (ZOSYN) IVPB 3.375 g       ? 3.375 g ?12.5 mL/hr over 240 Minutes Intravenous Every 8 hours 11/24/21 1541 12/01/21 1359  ? 11/24/21 1200  ceFEPIme (MAXIPIME) 2 g in sodium chloride 0.9 % 100 mL IVPB  Status:  Discontinued       ? 2 g ?200 mL/hr over 30 Minutes Intravenous Every 8 hours 11/24/21 1149 11/24/21 1532  ? 11/24/21 1130  ceFEPIme (MAXIPIME) 2 g in sodium chloride 0.9 % 100 mL IVPB  Status:  Discontinued       ? 2 g ?200 mL/hr over 30 Minutes Intravenous Every 12 hours 11/24/21 1129 11/24/21 1149  ? 11/24/21 1030  cefTRIAXone (ROCEPHIN) 2 g in sodium chloride 0.9 % 100 mL IVPB  Status:  Discontinued       ? 2 g ?200 mL/hr over 30 Minutes Intravenous  Once 11/24/21 1023 11/24/21 1126  ? 11/24/21 1030  metroNIDAZOLE (FLAGYL) IVPB 500 mg       ? 500 mg ?100 mL/hr over 60 Minutes Intravenous  Once 11/24/21 1023 11/24/21 1547  ? ?  ? ? ?Assessment/Plan: ?Intra-abdominal abscess with early signs of sepsis, s/p lap appy for perforated appendicitis on 4/9 by Dr. Royanne FootsStechshulte ?Await IR consult .   ?  ?FEN - NPO/IVFs/may have ice chips when done with IR procedure ?VTE - lovenox to start tomorrow, SCDs ?ID - cefepime/flagyl x1  dose in ED, zosyn 5/9--> ?Admit - inpatient ?  ?Elevated TB - slightly up at 1.4.  no evidence of gallbladder issues.  Recheck in am. ? total time 30 min  ? ? LOS: 1 day  ? ? ?Lorance Pickeral A Seneca Hoback ?11/25/2021 ? ?

## 2021-11-26 LAB — CBC
HCT: 31.7 % — ABNORMAL LOW (ref 39.0–52.0)
Hemoglobin: 10.9 g/dL — ABNORMAL LOW (ref 13.0–17.0)
MCH: 27.1 pg (ref 26.0–34.0)
MCHC: 34.4 g/dL (ref 30.0–36.0)
MCV: 78.9 fL — ABNORMAL LOW (ref 80.0–100.0)
Platelets: 389 10*3/uL (ref 150–400)
RBC: 4.02 MIL/uL — ABNORMAL LOW (ref 4.22–5.81)
RDW: 14.2 % (ref 11.5–15.5)
WBC: 22.5 10*3/uL — ABNORMAL HIGH (ref 4.0–10.5)
nRBC: 0 % (ref 0.0–0.2)

## 2021-11-26 NOTE — Progress Notes (Signed)
? ? ?Supervising Physician: Simonne Come ? ?Patient Status:  [facility] - In-pt ? ?Chief Complaint: ? ?Abdominal pain ?Intra-abdominal abscess with early signs of sepsis ? ?Subjective: ? ?Complaining of belly pain, not much improved, feeling hot ? ?Allergies: ?Patient has no known allergies. ? ?Medications: ?Prior to Admission medications   ?Medication Sig Start Date End Date Taking? Authorizing Provider  ?acetaminophen (TYLENOL) 500 MG tablet Take 500-1,000 mg by mouth every 6 (six) hours as needed for mild pain or headache (Tooth ache).   Yes [provider]  ?bismuth subsalicylate (PEPTO BISMOL) 262 MG chewable tablet Chew 262-524 mg by mouth as needed for indigestion.   Yes [provider]  ?ondansetron (ZOFRAN) 4 MG tablet Take 1 tablet (4 mg total) by mouth every 8 (eight) hours as needed for nausea or vomiting. 10/21/21  Yes Meccariello, Solmon Ice, DO  ?polyethylene glycol powder (GLYCOLAX/MIRALAX) 17 GM/SCOOP powder Take 17 g by mouth daily as needed for mild constipation.   Yes [provider]  ? ? ? ?Vital Signs: ?BP 112/87 (BP Location: Left Arm)   Pulse (!) 102   Temp 100 ?F (37.8 ?C) (Oral)   Resp 18   Ht 5\' 6"  (1.676 m)   Wt 200 lb (90.7 kg)   SpO2 97%   BMI 32.28 kg/m?  ? ?Physical Exam ?Constitutional:   ?   General: He is not in acute distress. ?HENT:  ?   Head: Normocephalic and atraumatic.  ?Pulmonary:  ?   Effort: Pulmonary effort is normal.  ?Abdominal:  ?   Palpations: Abdomen is soft.  ?   Tenderness: There is abdominal tenderness in the epigastric area.  ?Skin: ?   General: Skin is warm and dry.  ?Neurological:  ?   General: No focal deficit present.  ?   Mental Status: He is alert.  ?Psychiatric:     ?   Mood and Affect: Mood normal.     ?   Behavior: Behavior normal.  ?Drain Location: epigastric ?Size: Fr size: 10 Fr ?Date of placement: 11/26/21  ?Currently to: Drain collection device: suction bulb ?24 hour output:  ?Output by Drain (mL) 11/24/21 0700 - 11/24/21 1459  11/24/21 1500 - 11/24/21 2259 11/24/21 2300 - 11/25/21 0659 11/25/21 0700 - 11/25/21 1459 11/25/21 1500 - 11/25/21 2259 11/25/21 2300 - 11/26/21 0659 11/26/21 0700 - 11/26/21 1459 11/26/21 1500 - 11/26/21 1520  ?Closed System Drain 1 Midline RUQ    70 50  50   ? ? ?Current examination: ?Flushes/aspirates easily.  ?OP serosanguinous with debris ?Insertion site unremarkable. ?Suture and stat lock in place. ?Dressed appropriately.  ? ?Imaging: ?DG Chest 1 View ? ?Result Date: 11/24/2021 ?CLINICAL DATA:  Provided history: Nausea, vomiting, abdominal pain. EXAM: CHEST  1 VIEW COMPARISON:  Prior chest radiograph 10/30/2021. CT abdomen/pelvis 11/12/2021. FINDINGS: Heart size within normal limits. Persistent small right pleural effusion. Ill-defined opacity at the right lung base. No appreciable airspace consolidation within the left lung. No evidence of pneumothorax. No acute bony abnormality identified. IMPRESSION: Persistent small right pleural effusion. Ill-defined opacity within the right lung base. This has an appearance most suggestive of atelectasis. However, pneumonia is difficult to definitively exclude and clinical correlation is recommended. Electronically Signed   By: 11/14/2021 D.O.   On: 11/24/2021 10:54  ? ?CT ABDOMEN PELVIS W CONTRAST ? ?Result Date: 11/24/2021 ?CLINICAL DATA:  Insert his EXAM: CT ABDOMEN AND PELVIS WITH CONTRAST TECHNIQUE: Multidetector CT imaging of the abdomen and pelvis was performed using the standard  protocol following bolus administration of intravenous contrast. RADIATION DOSE REDUCTION: This exam was performed according to the departmental dose-optimization program which includes automated exposure control, adjustment of the mA and/or kV according to patient size and/or use of iterative reconstruction technique. CONTRAST:  80mL OMNIPAQUE IOHEXOL 350 MG/ML SOLN COMPARISON:  CT 11/12/2021 FINDINGS: Lower chest: Small right pleural effusion, slightly decreased from prior, with adjacent  atelectasis. Hepatobiliary: Along the anterior and inferior surface of the left hepatic lobe, there is a rim enhancing collection measuring 5.4 x 3.4 x 7.1 cm with adjacent inflammatory stranding (series 3, image 31, series 7, image 115). This collection also abuts the anterior body of the stomach. This previously measured up to 2.8 x 2.8 x 3.3 cm (remeasured for consistency). There is adjacent hypoattenuation in the liver parenchyma. The gallbladder is contracted with a notable 2.2 cm stone in the gallbladder fundus. No intrahepatic ductal dilation. Pancreas: Unremarkable. No pancreatic ductal dilatation or surrounding inflammatory changes. Spleen: Normal in size without focal abnormality. Adrenals/Urinary Tract: Adrenal glands are unremarkable. No hydronephrosis or nephrolithiasis. Bladder is unremarkable. Stomach/Bowel: No evidence of bowel obstruction. Prior appendectomy.Lower abdominal surgical drain has been removed since the prior exam. No residual fluid collection in the pelvis. Vascular/Lymphatic: No significant vascular findings are present. No enlarged abdominal or pelvic lymph nodes. Reproductive: Unremarkable. Other: No abdominal wall hernia or abnormality. No abdominopelvic ascites. Removal of prior right gluteal approach drain. Musculoskeletal: Prior right femur fixation, partially visualized. No acute osseous abnormality. IMPRESSION: Anterior epigastric abscess along the anterior and inferior aspect of the left hepatic lobe, measuring 5.4 x 3.4 x 7.1 cm, with adjacent extensive inflammation and abutment of the anterior aspect of the gastric body. Adjacent hypoattenuation in the liver parenchyma, likely reactive. Recent appendectomy with removal of prior lower abdominal surgical drain. No residual fluid collection in the pelvis. Slightly decreased right pleural effusion with adjacent basilar atelectasis in comparison to prior. Electronically Signed   By: Caprice RenshawJacob  Kahn M.D.   On: 11/24/2021 13:45  ? ?CT  IMAGE GUIDED DRAINAGE BY PERCUTANEOUS CATHETER ? ?Result Date: 11/25/2021 ?CLINICAL DATA:  History of ruptured appendicitis, appendectomy and previous drainage of pelvic abscess. New abscess has formed in the anterior epigastric region anterior to the stomach and abutting the left lobe of the liver. EXAM: CT GUIDED CATHETER DRAINAGE OF EPIGASTRIC PERITONEAL ABSCESS ANESTHESIA/SEDATION: Moderate (conscious) sedation was employed during this procedure. A total of Versed 5.0 mg and Fentanyl 150 mcg was administered intravenously by radiology nursing. Moderate Sedation Time: 21 minutes. The patient's level of consciousness and vital signs were monitored continuously by radiology nursing throughout the procedure under my direct supervision. PROCEDURE: The procedure, risks, benefits, and alternatives were explained to the patient. Questions regarding the procedure were encouraged and answered. The patient understands and consents to the procedure. A time out was performed prior to initiating the procedure. CT was performed through the upper abdomen in a supine position. The epigastric region was prepped with chlorhexidine in a sterile fashion, and a sterile drape was applied covering the operative field. A sterile gown and sterile gloves were used for the procedure. Local anesthesia was provided with 1% Lidocaine. Under CT guidance, an 18 gauge trocar needle was advanced into an anterior epigastric fluid collection anterior to the stomach and adjacent to the left lobe of the liver. A small amount of fluid was aspirated. A guidewire was advanced into the collection and the needle removed. The percutaneous tract was dilated and a 10 French percutaneous drainage catheter advanced over  the wire. Catheter position was confirmed by CT. Additional fluid sample was aspirated and sent for culture analysis. The drain was flushed with saline and attached to a suction bulb. The drainage catheter was secured at the skin with a Prolene  retention suture and StatLock device. COMPLICATIONS: None FINDINGS: Aspiration at the level of the midline epigastric abscess yielded grossly purulent fluid. After drain placement there is good return of pur

## 2021-11-26 NOTE — Progress Notes (Addendum)
Pt c/o pain and burning in R IV. Pt flushed IV and with noted blood return. Zosyn started and potassium bag completed. RN will continue K after zosyn and monitor IV site for continued pain before restarting K+. Pt states no pain at time of zosyn infusing.    ? ?0500 Pt refused 0600 Zosyn and Tylenol and IV flush along with IV K+ stating he has had enough meds and wanted to sleep. Pt is alert and oriented. He states he didn't know he was receiving Potassium and provided education about his condition and the risks and benefits associated with refusing medication. He stated he wanted to sleep and talk to the doctor this morning. He received his IV dilaudid at 0052. Resp are even and unlabored vitals are WNL.   ?

## 2021-11-26 NOTE — Progress Notes (Signed)
? ?Subjective/Chief Complaint: ?Still with quite a bit of epigastric pain despite drain placement thus far.  Tolerating some CLD, but still with some nausea, but no emesis.   ? ?Objective: ?Vital signs in last 24 hours: ?Temp:  [98.6 ?F (37 ?C)-102.8 ?F (39.3 ?C)] 100.7 ?F (38.2 ?C) (05/11 0532) ?Pulse Rate:  [104-126] 120 (05/11 0532) ?Resp:  [18-35] 20 (05/11 0532) ?BP: (100-140)/(71-101) 119/77 (05/11 0532) ?SpO2:  [91 %-99 %] 95 % (05/11 0532) ?Last BM Date : 11/22/21 ? ?Intake/Output from previous day: ?05/10 0701 - 05/11 0700 ?In: 5  ?Out: 170 [Drains:170] ?Intake/Output this shift: ?No intake/output data recorded. ? ?General appearance: alert and cooperative ?Resp: clear to auscultation bilaterally ?Cardio: tachy but regualr ?Abd: soft, but with some distention especially in the upper abdomen, IR drain in place with minimal cloudy serosang output.  Few BS, tender as expected in upper abdomen.  Non tender in lower abdomen ? ?Lab Results:  ?Recent Labs  ?  11/25/21 ?0213 11/26/21 ?0843  ?WBC 18.1* 22.5*  ?HGB 10.6* 10.9*  ?HCT 30.3* 31.7*  ?PLT 514* 389  ? ?BMET ?Recent Labs  ?  11/24/21 ?1029 11/25/21 ?0213  ?NA 134* 136  ?K 3.8 3.7  ?CL 99 101  ?CO2 21* 27  ?GLUCOSE 104* 124*  ?BUN 6 5*  ?CREATININE 1.07 1.14  ?CALCIUM 9.6 8.8*  ? ?PT/INR ?Recent Labs  ?  11/24/21 ?1029  ?LABPROT 15.1  ?INR 1.2  ? ?ABG ?No results for input(s): PHART, HCO3 in the last 72 hours. ? ?Invalid input(s): PCO2, PO2 ? ?Studies/Results: ?DG Chest 1 View ? ?Result Date: 11/24/2021 ?CLINICAL DATA:  Provided history: Nausea, vomiting, abdominal pain. EXAM: CHEST  1 VIEW COMPARISON:  Prior chest radiograph 10/30/2021. CT abdomen/pelvis 11/12/2021. FINDINGS: Heart size within normal limits. Persistent small right pleural effusion. Ill-defined opacity at the right lung base. No appreciable airspace consolidation within the left lung. No evidence of pneumothorax. No acute bony abnormality identified. IMPRESSION: Persistent small right  pleural effusion. Ill-defined opacity within the right lung base. This has an appearance most suggestive of atelectasis. However, pneumonia is difficult to definitively exclude and clinical correlation is recommended. Electronically Signed   By: Kellie Simmering D.O.   On: 11/24/2021 10:54  ? ?CT ABDOMEN PELVIS W CONTRAST ? ?Result Date: 11/24/2021 ?CLINICAL DATA:  Insert his EXAM: CT ABDOMEN AND PELVIS WITH CONTRAST TECHNIQUE: Multidetector CT imaging of the abdomen and pelvis was performed using the standard protocol following bolus administration of intravenous contrast. RADIATION DOSE REDUCTION: This exam was performed according to the departmental dose-optimization program which includes automated exposure control, adjustment of the mA and/or kV according to patient size and/or use of iterative reconstruction technique. CONTRAST:  21mL OMNIPAQUE IOHEXOL 350 MG/ML SOLN COMPARISON:  CT 11/12/2021 FINDINGS: Lower chest: Small right pleural effusion, slightly decreased from prior, with adjacent atelectasis. Hepatobiliary: Along the anterior and inferior surface of the left hepatic lobe, there is a rim enhancing collection measuring 5.4 x 3.4 x 7.1 cm with adjacent inflammatory stranding (series 3, image 31, series 7, image 115). This collection also abuts the anterior body of the stomach. This previously measured up to 2.8 x 2.8 x 3.3 cm (remeasured for consistency). There is adjacent hypoattenuation in the liver parenchyma. The gallbladder is contracted with a notable 2.2 cm stone in the gallbladder fundus. No intrahepatic ductal dilation. Pancreas: Unremarkable. No pancreatic ductal dilatation or surrounding inflammatory changes. Spleen: Normal in size without focal abnormality. Adrenals/Urinary Tract: Adrenal glands are unremarkable. No hydronephrosis or nephrolithiasis.  Bladder is unremarkable. Stomach/Bowel: No evidence of bowel obstruction. Prior appendectomy.Lower abdominal surgical drain has been removed since  the prior exam. No residual fluid collection in the pelvis. Vascular/Lymphatic: No significant vascular findings are present. No enlarged abdominal or pelvic lymph nodes. Reproductive: Unremarkable. Other: No abdominal wall hernia or abnormality. No abdominopelvic ascites. Removal of prior right gluteal approach drain. Musculoskeletal: Prior right femur fixation, partially visualized. No acute osseous abnormality. IMPRESSION: Anterior epigastric abscess along the anterior and inferior aspect of the left hepatic lobe, measuring 5.4 x 3.4 x 7.1 cm, with adjacent extensive inflammation and abutment of the anterior aspect of the gastric body. Adjacent hypoattenuation in the liver parenchyma, likely reactive. Recent appendectomy with removal of prior lower abdominal surgical drain. No residual fluid collection in the pelvis. Slightly decreased right pleural effusion with adjacent basilar atelectasis in comparison to prior. Electronically Signed   By: Maurine Simmering M.D.   On: 11/24/2021 13:45  ? ?CT IMAGE GUIDED DRAINAGE BY PERCUTANEOUS CATHETER ? ?Result Date: 11/25/2021 ?CLINICAL DATA:  History of ruptured appendicitis, appendectomy and previous drainage of pelvic abscess. New abscess has formed in the anterior epigastric region anterior to the stomach and abutting the left lobe of the liver. EXAM: CT GUIDED CATHETER DRAINAGE OF EPIGASTRIC PERITONEAL ABSCESS ANESTHESIA/SEDATION: Moderate (conscious) sedation was employed during this procedure. A total of Versed 5.0 mg and Fentanyl 150 mcg was administered intravenously by radiology nursing. Moderate Sedation Time: 21 minutes. The patient's level of consciousness and vital signs were monitored continuously by radiology nursing throughout the procedure under my direct supervision. PROCEDURE: The procedure, risks, benefits, and alternatives were explained to the patient. Questions regarding the procedure were encouraged and answered. The patient understands and consents to  the procedure. A time out was performed prior to initiating the procedure. CT was performed through the upper abdomen in a supine position. The epigastric region was prepped with chlorhexidine in a sterile fashion, and a sterile drape was applied covering the operative field. A sterile gown and sterile gloves were used for the procedure. Local anesthesia was provided with 1% Lidocaine. Under CT guidance, an 18 gauge trocar needle was advanced into an anterior epigastric fluid collection anterior to the stomach and adjacent to the left lobe of the liver. A small amount of fluid was aspirated. A guidewire was advanced into the collection and the needle removed. The percutaneous tract was dilated and a 10 French percutaneous drainage catheter advanced over the wire. Catheter position was confirmed by CT. Additional fluid sample was aspirated and sent for culture analysis. The drain was flushed with saline and attached to a suction bulb. The drainage catheter was secured at the skin with a Prolene retention suture and StatLock device. COMPLICATIONS: None FINDINGS: Aspiration at the level of the midline epigastric abscess yielded grossly purulent fluid. After drain placement there is good return of purulent fluid. IMPRESSION: CT-guided percutaneous catheter drainage anterior epigastric abscess yielding purulent fluid. A sample of fluid was sent for culture analysis. A 10 French percutaneous drainage catheter was placed and attached to suction bulb drainage. RADIATION DOSE REDUCTION: This exam was performed according to the departmental dose-optimization program which includes automated exposure control, adjustment of the mA and/or kV according to patient size and/or use of iterative reconstruction technique. Electronically Signed   By: Aletta Edouard M.D.   On: 11/25/2021 14:19  ? ?US Abdomen Limited RUQ (LIVER/GB) ? ?Result Date: 11/24/2021 ?CLINICAL DATA:  Abdominal pain for 5 days. EXAM: ULTRASOUND ABDOMEN LIMITED  RIGHT UPPER QUADRANT  COMPARISON:  CT AP 11/12/2021 FINDINGS: Gallbladder: 2.8 cm gallstone identified as noted on recent CT. The gallbladder appears partially decompressed. Nonspecific gallbladder wall thickening

## 2021-11-26 NOTE — Progress Notes (Signed)
?   11/25/21 2012  ?Assess: MEWS Score  ?Temp 98.6 ?F (37 ?C)  ?BP (!) 132/93  ?Pulse Rate (!) 121  ?Resp 18  ?Level of Consciousness Alert  ?SpO2 95 %  ?O2 Device Room Air  ?Assess: MEWS Score  ?MEWS Temp 0  ?MEWS Systolic 0  ?MEWS Pulse 2  ?MEWS RR 0  ?MEWS LOC 0  ?MEWS Score 2  ?MEWS Score Color Yellow  ?Assess: if the MEWS score is Yellow or Red  ?Were vital signs taken at a resting state? Yes  ?Focused Assessment No change from prior assessment  ?Early Detection of Sepsis Score *See Row Information* Medium  ?MEWS guidelines implemented *See Row Information* No, previously yellow, continue vital signs every 4 hours  ?Treat  ?MEWS Interventions Administered scheduled meds/treatments  ?Pain Scale 0-10  ?Pain Score 8  ?Pain Type Surgical pain  ?Pain Location Abdomen  ?Pain Descriptors / Indicators Aching  ?Pain Frequency Constant  ?Pain Onset On-going  ?Pain Intervention(s) Repositioned;Ambulation/increased activity ?(refused for dilaudid iv)  ?Take Vital Signs  ?Increase Vital Sign Frequency  Yellow: Q 2hr X 2 then Q 4hr X 2, if remains yellow, continue Q 4hrs  ?Escalate  ?MEWS: Escalate Yellow: discuss with charge nurse/RN and consider discussing with provider and RRT  ?Notify: Charge Nurse/RN  ?Name of Charge Nurse/RN Notified jewel rn  ?Date Charge Nurse/RN Notified 11/25/21  ?Time Charge Nurse/RN Notified 2100  ?Document  ?Progress note created (see row info) Yes  ? ? ?

## 2021-11-26 NOTE — Progress Notes (Signed)
Refused for 0230 due vital signs.  ?

## 2021-11-26 NOTE — Progress Notes (Signed)
?   11/26/21 0028  ?Assess: MEWS Score  ?Temp (!) 102.8 ?F (39.3 ?C)  ?BP 126/72  ?Pulse Rate (!) 126  ?Resp 18  ?SpO2 91 %  ?Assess: MEWS Score  ?MEWS Temp 2  ?MEWS Systolic 0  ?MEWS Pulse 2  ?MEWS RR 0  ?MEWS LOC 0  ?MEWS Score 4  ?MEWS Score Color Red  ?Assess: if the MEWS score is Yellow or Red  ?Were vital signs taken at a resting state? Yes  ?Focused Assessment Change from prior assessment (see assessment flowsheet)  ?Early Detection of Sepsis Score *See Row Information* Medium  ?MEWS guidelines implemented *See Row Information* Yes  ?Treat  ?MEWS Interventions Administered scheduled meds/treatments;Administered prn meds/treatments  ?Pain Scale 0-10  ?Pain Score 5  ?Pain Type Surgical pain  ?Pain Location Abdomen  ?Pain Descriptors / Indicators Aching  ?Pain Frequency Intermittent  ?Pain Onset On-going  ?Pain Intervention(s) Relaxation ?(rfefused po oxycodone. patient stated "i'll wait for  dilaudid")  ?Multiple Pain Sites No  ?Take Vital Signs  ?Increase Vital Sign Frequency  Red: Q 1hr X 4 then Q 4hr X 4, if remains red, continue Q 4hrs  ?Escalate  ?MEWS: Escalate Red: discuss with charge nurse/RN and provider, consider discussing with RRT  ?Notify: Charge Nurse/RN  ?Name of Charge Nurse/RN Notified jewel rn  ?Date Charge Nurse/RN Notified 11/26/21  ?Time Charge Nurse/RN Notified 0040  ?Notify: Provider  ?Provider Name/Title matthew [email or web address]  ?Date Provider Notified 11/26/21  ?Time Provider Notified [phone]  ?Method of Notification  ?(secured chat)  ?Notification Reason Change in status  ?Provider response No new orders ?(verified about the tylenol)  ?Date of Provider Response 11/26/21  ?Time of Provider Response [phone]  ?Document  ?Patient Outcome Stabilized after interventions  ?Progress note created (see row info) Yes  ? ?Patient not in distress, refused non pharmacological intervention to manage fever, denies any sob or chest pain. Refused for the scheduled vital signs between 0130 to 0430 this morning.  Continued to monitor.  ?

## 2021-11-26 NOTE — Progress Notes (Signed)
Refused for 0330 scheduled vital signs. ?

## 2021-11-27 LAB — AEROBIC/ANAEROBIC CULTURE W GRAM STAIN (SURGICAL/DEEP WOUND): Special Requests: NORMAL

## 2021-11-27 LAB — BASIC METABOLIC PANEL
Anion gap: 9 (ref 5–15)
BUN: 5 mg/dL — ABNORMAL LOW (ref 6–20)
CO2: 29 mmol/L (ref 22–32)
Calcium: 8.8 mg/dL — ABNORMAL LOW (ref 8.9–10.3)
Chloride: 98 mmol/L (ref 98–111)
Creatinine, Ser: 1.01 mg/dL (ref 0.61–1.24)
GFR, Estimated: >60 mL/min (ref >60)
Glucose, Bld: 113 mg/dL — ABNORMAL HIGH (ref 70–99)
Potassium: 3.8 mmol/L (ref 3.5–5.1)
Sodium: 136 mmol/L (ref 135–145)

## 2021-11-27 LAB — CBC
HCT: 32.6 % — ABNORMAL LOW (ref 39.0–52.0)
Hemoglobin: 11.5 g/dL — ABNORMAL LOW (ref 13.0–17.0)
MCH: 27.2 pg (ref 26.0–34.0)
MCHC: 35.3 g/dL (ref 30.0–36.0)
MCV: 77.1 fL — ABNORMAL LOW (ref 80.0–100.0)
Platelets: 568 10*3/uL — ABNORMAL HIGH (ref 150–400)
RBC: 4.23 MIL/uL (ref 4.22–5.81)
RDW: 14.3 % (ref 11.5–15.5)
WBC: 19.6 10*3/uL — ABNORMAL HIGH (ref 4.0–10.5)
nRBC: 0 % (ref 0.0–0.2)

## 2021-11-27 MED ORDER — SODIUM CHLORIDE 0.9 % IV SOLN: INTRAVENOUS | Status: DC

## 2021-11-27 NOTE — Progress Notes (Signed)
? ?Subjective/Chief Complaint: ?Still with quite a bit of epigastric pain but overall looks better.  No nausea with CLD yesterday.  Ambulating some.  RN notes reviewed regarding patient refusing his abx, tylenol, labs, etc overnight bc he wanted to sleep.  Discussed the importance of these things with him.  ? ?Objective: ?Vital signs in last 24 hours: ?Temp:  [98.7 ?F (37.1 ?C)-100 ?F (37.8 ?C)] 98.9 ?F (37.2 ?C) (05/12 2094) ?Pulse Rate:  [84-106] 98 (05/12 0835) ?Resp:  [17-18] 17 (05/12 0835) ?BP: (112-136)/(74-88) 136/88 (05/12 0835) ?SpO2:  [92 %-98 %] 98 % (05/12 0835) ?Last BM Date : 11/22/21 ? ?Intake/Output from previous day: ?05/11 0701 - 05/12 0700 ?In: 3350.5 [P.O.:150; I.V.:3195.5] ?Out: 875 [Urine:850; Drains:25] ?Intake/Output this shift: ?No intake/output data recorded. ? ?General appearance: alert and cooperative ?Resp: clear to auscultation bilaterally ?Cardio: less tachy today, regular ?Abd: soft, less distention in upper abdomen today, drain in place with minimal slightly cloudy serosang output. (75cc/24h), +BS   ? ?Lab Results:  ?Recent Labs  ?  11/25/21 ?0213 11/26/21 ?7096  ?WBC 18.1* 22.5*  ?HGB 10.6* 10.9*  ?HCT 30.3* 31.7*  ?PLT 514* 389  ? ?BMET ?Recent Labs  ?  11/24/21 ?1029 11/25/21 ?0213  ?NA 134* 136  ?K 3.8 3.7  ?CL 99 101  ?CO2 21* 27  ?GLUCOSE 104* 124*  ?BUN 6 5*  ?CREATININE 1.07 1.14  ?CALCIUM 9.6 8.8*  ? ?PT/INR ?Recent Labs  ?  11/24/21 ?1029  ?LABPROT 15.1  ?INR 1.2  ? ?ABG ?No results for input(s): PHART, HCO3 in the last 72 hours. ? ?Invalid input(s): PCO2, PO2 ? ?Studies/Results: ?CT IMAGE GUIDED DRAINAGE BY PERCUTANEOUS CATHETER ? ?Result Date: 11/25/2021 ?CLINICAL DATA:  History of ruptured appendicitis, appendectomy and previous drainage of pelvic abscess. New abscess has formed in the anterior epigastric region anterior to the stomach and abutting the left lobe of the liver. EXAM: CT GUIDED CATHETER DRAINAGE OF EPIGASTRIC PERITONEAL ABSCESS ANESTHESIA/SEDATION:  Moderate (conscious) sedation was employed during this procedure. A total of Versed 5.0 mg and Fentanyl 150 mcg was administered intravenously by radiology nursing. Moderate Sedation Time: 21 minutes. The patient's level of consciousness and vital signs were monitored continuously by radiology nursing throughout the procedure under my direct supervision. PROCEDURE: The procedure, risks, benefits, and alternatives were explained to the patient. Questions regarding the procedure were encouraged and answered. The patient understands and consents to the procedure. A time out was performed prior to initiating the procedure. CT was performed through the upper abdomen in a supine position. The epigastric region was prepped with chlorhexidine in a sterile fashion, and a sterile drape was applied covering the operative field. A sterile gown and sterile gloves were used for the procedure. Local anesthesia was provided with 1% Lidocaine. Under CT guidance, an 18 gauge trocar needle was advanced into an anterior epigastric fluid collection anterior to the stomach and adjacent to the left lobe of the liver. A small amount of fluid was aspirated. A guidewire was advanced into the collection and the needle removed. The percutaneous tract was dilated and a 10 French percutaneous drainage catheter advanced over the wire. Catheter position was confirmed by CT. Additional fluid sample was aspirated and sent for culture analysis. The drain was flushed with saline and attached to a suction bulb. The drainage catheter was secured at the skin with a Prolene retention suture and StatLock device. COMPLICATIONS: None FINDINGS: Aspiration at the level of the midline epigastric abscess yielded grossly purulent fluid. After drain placement  there is good return of purulent fluid. IMPRESSION: CT-guided percutaneous catheter drainage anterior epigastric abscess yielding purulent fluid. A sample of fluid was sent for culture analysis. A 10 French  percutaneous drainage catheter was placed and attached to suction bulb drainage. RADIATION DOSE REDUCTION: This exam was performed according to the departmental dose-optimization program which includes automated exposure control, adjustment of the mA and/or kV according to patient size and/or use of iterative reconstruction technique. Electronically Signed   By: Irish Lack M.D.   On: 11/25/2021 14:19   ? ?Anti-infectives: ?Anti-infectives (From admission, onward)  ? ? Start     Dose/Rate Route Frequency Ordered Stop  ? 11/24/21 1545  piperacillin-tazobactam (ZOSYN) IVPB 3.375 g       ? 3.375 g ?12.5 mL/hr over 240 Minutes Intravenous Every 8 hours 11/24/21 1541 12/01/21 1359  ? 11/24/21 1200  ceFEPIme (MAXIPIME) 2 g in sodium chloride 0.9 % 100 mL IVPB  Status:  Discontinued       ? 2 g ?200 mL/hr over 30 Minutes Intravenous Every 8 hours 11/24/21 1149 11/24/21 1532  ? 11/24/21 1130  ceFEPIme (MAXIPIME) 2 g in sodium chloride 0.9 % 100 mL IVPB  Status:  Discontinued       ? 2 g ?200 mL/hr over 30 Minutes Intravenous Every 12 hours 11/24/21 1129 11/24/21 1149  ? 11/24/21 1030  cefTRIAXone (ROCEPHIN) 2 g in sodium chloride 0.9 % 100 mL IVPB  Status:  Discontinued       ? 2 g ?200 mL/hr over 30 Minutes Intravenous  Once 11/24/21 1023 11/24/21 1126  ? 11/24/21 1030  metroNIDAZOLE (FLAGYL) IVPB 500 mg       ? 500 mg ?100 mL/hr over 60 Minutes Intravenous  Once 11/24/21 1023 11/24/21 1547  ? ?  ? ? ?Assessment/Plan: ?Intra-abdominal abscess with early signs of sepsis, s/p lap appy for perforated appendicitis on 4/9 by Dr. Royanne Foots ?-cont abx therapy.  Cultures with strep constellatus, still pending ?-blood cultures negative ?-HR and fever curve downtrending.  T max 100 in last 24 hrs. ?-FLD today ?-mobilize ?-cont to closely monitor ?-decrease IVFs today.  UOP good. ?-labs today pending as he refused this morning. ?  ?FEN - FLD/IVFs (down to 50cc/hr) ?VTE - lovenox, SCDs ?ID - cefepime/flagyl x1 dose in ED, zosyn  5/9--> ? ?  ?Elevated TB - slightly up at 1.4 on admit but resolved ? ? LOS: 3 days  ? ? ?Letha Cape ?11/27/2021 ? ?

## 2021-11-27 NOTE — Progress Notes (Signed)
Pt refused enoxaparin, notified PA via secure chat. ?

## 2021-11-28 LAB — BASIC METABOLIC PANEL
Anion gap: 8 (ref 5–15)
BUN: <5 mg/dL — ABNORMAL LOW (ref 6–20)
CO2: 28 mmol/L (ref 22–32)
Calcium: 8.6 mg/dL — ABNORMAL LOW (ref 8.9–10.3)
Chloride: 100 mmol/L (ref 98–111)
Creatinine, Ser: 0.95 mg/dL (ref 0.61–1.24)
GFR, Estimated: >60 mL/min (ref >60)
Glucose, Bld: 97 mg/dL (ref 70–99)
Potassium: 3.7 mmol/L (ref 3.5–5.1)
Sodium: 136 mmol/L (ref 135–145)

## 2021-11-28 LAB — CBC
HCT: 30.2 % — ABNORMAL LOW (ref 39.0–52.0)
Hemoglobin: 10.7 g/dL — ABNORMAL LOW (ref 13.0–17.0)
MCH: 27.3 pg (ref 26.0–34.0)
MCHC: 35.4 g/dL (ref 30.0–36.0)
MCV: 77 fL — ABNORMAL LOW (ref 80.0–100.0)
Platelets: 586 10*3/uL — ABNORMAL HIGH (ref 150–400)
RBC: 3.92 MIL/uL — ABNORMAL LOW (ref 4.22–5.81)
RDW: 14.4 % (ref 11.5–15.5)
WBC: 15.5 10*3/uL — ABNORMAL HIGH (ref 4.0–10.5)
nRBC: 0 % (ref 0.0–0.2)

## 2021-11-28 NOTE — Progress Notes (Signed)
Patient ID: Brad Lyons, male   DOB: 1995-08-30, 26 y.o.   MRN: [id number] ?[facility] Progress Note:   * No surgery found *  ?Subjective: ?Mental status is clear.  Complaints hungry. ?Objective: ?Vital signs in last 24 hours: ?Temp:  [98.1 ?F (36.7 ?C)-99.3 ?F (37.4 ?C)] 98.2 ?F (36.8 ?C) (05/13 0424) ?Pulse Rate:  [73-95] 73 (05/13 0424) ?Resp:  [17-18] 17 (05/13 0424) ?BP: (119-126)/(81-85) 120/84 (05/13 0424) ?SpO2:  [97 %-98 %] 98 % (05/13 0424) ? ?Intake/Output from previous day: ?05/12 0701 - 05/13 0700 ?In: 300.7 [I.V.:0.8; IV Piggyback:294.9] ?Out: 25 [Drains:25] ?Intake/Output this shift: ?No intake/output data recorded. ? ?Physical Exam: Work of breathing is ok.  Passing flatus.  Perc drainage is light green ? ?Lab Results:  ?Results for orders placed or performed during the hospital encounter of 11/24/21 (from the past 48 hour(s))  ?CBC     Status: Abnormal  ? Collection Time: 11/27/21  6:28 PM  ?Result Value Ref Range  ? WBC 19.6 (H) 4.0 - 10.5 K/uL  ?  Comment: REPEATED TO VERIFY  ? RBC 4.23 4.22 - 5.81 MIL/uL  ? Hemoglobin 11.5 (L) 13.0 - 17.0 g/dL  ? HCT 32.6 (L) 39.0 - 52.0 %  ? MCV 77.1 (L) 80.0 - 100.0 fL  ? MCH 27.2 26.0 - 34.0 pg  ? MCHC 35.3 30.0 - 36.0 g/dL  ? RDW 14.3 11.5 - 15.5 %  ? Platelets 568 (H) 150 - 400 K/uL  ?  Comment: REPEATED TO VERIFY  ? nRBC 0.0 0.0 - 0.2 %  ?  Comment: Performed at [facility], [address], [place], [place] [address]  ?Basic metabolic panel     Status: Abnormal  ? Collection Time: 11/27/21  6:28 PM  ?Result Value Ref Range  ? Sodium 136 135 - 145 mmol/L  ? Potassium 3.8 3.5 - 5.1 mmol/L  ? Chloride 98 98 - 111 mmol/L  ? CO2 29 22 - 32 mmol/L  ? Glucose, Bld 113 (H) 70 - 99 mg/dL  ?  Comment: Glucose reference range applies only to samples taken after fasting for at least 8 hours.  ? BUN 5 (L) 6 - 20 mg/dL  ? Creatinine, Ser 1.01 0.61 - 1.24 mg/dL  ? Calcium 8.8 (L) 8.9 - 10.3 mg/dL  ? GFR, Estimated >60 >60 mL/min  ?  Comment:  (NOTE) ?Calculated using the CKD-EPI Creatinine Equation (2021) ?  ? Anion gap 9 5 - 15  ?  Comment: Performed at [facility], [address] [place], [place] [address]  ?CBC     Status: Abnormal  ? Collection Time: 11/28/21  6:44 AM  ?Result Value Ref Range  ? WBC 15.5 (H) 4.0 - 10.5 K/uL  ? RBC 3.92 (L) 4.22 - 5.81 MIL/uL  ? Hemoglobin 10.7 (L) 13.0 - 17.0 g/dL  ? HCT 30.2 (L) 39.0 - 52.0 %  ? MCV 77.0 (L) 80.0 - 100.0 fL  ? MCH 27.3 26.0 - 34.0 pg  ? MCHC 35.4 30.0 - 36.0 g/dL  ? RDW 14.4 11.5 - 15.5 %  ? Platelets 586 (H) 150 - 400 K/uL  ? nRBC 0.0 0.0 - 0.2 %  ?  Comment: Performed at [facility], [address] [place], [place] [address]  ?Basic metabolic panel     Status: Abnormal  ? Collection Time: 11/28/21  6:44 AM  ?Result Value Ref Range  ? Sodium 136 135 - 145 mmol/L  ? Potassium  3.7 3.5 - 5.1 mmol/L  ? Chloride 100 98 - 111 mmol/L  ? CO2 28 22 - 32 mmol/L  ? Glucose, Bld 97 70 - 99 mg/dL  ?  Comment: Glucose reference range applies only to samples taken after fasting for at least 8 hours.  ? BUN <5 (L) 6 - 20 mg/dL  ? Creatinine, Ser 0.95 0.61 - 1.24 mg/dL  ? Calcium 8.6 (L) 8.9 - 10.3 mg/dL  ? GFR, Estimated >60 >60 mL/min  ?  Comment: (NOTE) ?Calculated using the CKD-EPI Creatinine Equation (2021) ?  ? Anion gap 8 5 - 15  ?  Comment: Performed at [facility], [address] [place], [place] [address]  ? ? ?Radiology/Results: ?No results found. ? ?Anti-infectives: ?Anti-infectives (From admission, onward)  ? ? Start     Dose/Rate Route Frequency Ordered Stop  ? 11/24/21 1545  piperacillin-tazobactam (ZOSYN) IVPB 3.375 g       ? 3.375 g ?12.5 mL/hr over 240 Minutes Intravenous Every 8 hours 11/24/21 1541 12/01/21 1359  ? 11/24/21 1200  ceFEPIme (MAXIPIME) 2 g in sodium chloride 0.9 % 100 mL IVPB  Status:  Discontinued       ? 2 g ?200 mL/hr over 30 Minutes Intravenous Every 8 hours 11/24/21 1149 11/24/21 1532  ? 11/24/21 1130  ceFEPIme (MAXIPIME) 2 g in sodium chloride 0.9 % 100  mL IVPB  Status:  Discontinued       ? 2 g ?200 mL/hr over 30 Minutes Intravenous Every 12 hours 11/24/21 1129 11/24/21 1149  ? 11/24/21 1030  cefTRIAXone (ROCEPHIN) 2 g in sodium chloride 0.9 % 100 mL IVPB  Status:  Discontinued       ? 2 g ?200 mL/hr over 30 Minutes Intravenous  Once 11/24/21 1023 11/24/21 1126  ? 11/24/21 1030  metroNIDAZOLE (FLAGYL) IVPB 500 mg       ? 500 mg ?100 mL/hr over 60 Minutes Intravenous  Once 11/24/21 1023 11/24/21 1547  ? ?  ? ? ?Assessment/Plan: ?Problem List: ?Patient Active Problem List  ? Diagnosis Date Noted  ? Intra-abdominal fluid collection 11/24/2021  ? Acute perforated appendicitis 10/25/2021  ? ? ?Apparent return of bowel function.  Will advance diet.  ?* No surgery found *  ? ? LOS: 4 days  ? ?Matt B. Daphine Deutscher, MD, FACS ? ?[facility] ?[phone] to reach the surgeon on call.   ? ?11/28/2021 9:03 AM  ?

## 2021-11-28 NOTE — Plan of Care (Signed)

## 2021-11-28 NOTE — Progress Notes (Signed)
? ? ?Referring Physician(s): ?Dr. Maisie Fus Cornett ? ?Supervising Physician: Malachy Moan ? ?Patient Status:  [facility] - In-pt ? ?Chief Complaint: ? ?F/U drain ? ?Brief History: ? ?Brad Lyons is a 26 y.o. male who underwent lap appendectomy 10/25/21. ? ?He was found to have purulent peritonitis, fecalith, and perforation.  ? ?He did require percutaneous drainage of a post-operative pelvic abscess 11/01/21 which was ultimately removed after resolve of fluid collection 11/12/21.   ? ?He returned to [facility] ED on 11/24/21 with nausea, vomiting, abdominal pain.  ?WBC 18K, Tmax 101.4 ?  ?CT Abdomen Pelvis showed= ?Anterior epigastric abscess along the anterior and inferior aspect ?of the left hepatic lobe, measuring 5.4 x 3.4 x 7.1 cm, with ?adjacent extensive inflammation and abutment of the anterior aspect ?of the gastric body. Adjacent hypoattenuation in the liver ?parenchyma, likely reactive. ?  ?He underwent a new epigastric drain placement on 11/25/21 by Dr. Fredia Sorrow ?  ? ?Subjective: ? ?Doing well. Sitting up in bed. No complaints. ? ?Allergies: ?Patient has no known allergies. ? ?Medications: ?Prior to Admission medications   ?Medication Sig Start Date End Date Taking? Authorizing Provider  ?acetaminophen (TYLENOL) 500 MG tablet Take 500-1,000 mg by mouth every 6 (six) hours as needed for mild pain or headache (Tooth ache).   Yes [provider]  ?bismuth subsalicylate (PEPTO BISMOL) 262 MG chewable tablet Chew 262-524 mg by mouth as needed for indigestion.   Yes [provider]  ?ondansetron (ZOFRAN) 4 MG tablet Take 1 tablet (4 mg total) by mouth every 8 (eight) hours as needed for nausea or vomiting. 10/21/21  Yes Meccariello, Solmon Ice, DO  ?polyethylene glycol powder (GLYCOLAX/MIRALAX) 17 GM/SCOOP powder Take 17 g by mouth daily as needed for mild constipation.   Yes [provider]  ? ? ? ?Vital Signs: ?BP 127/74 (BP Location: Right Arm)   Pulse 83   Temp 98.3 ?F (36.8 ?C) (Oral)   Resp 18    Ht 5\' 6"  (1.676 m)   Wt 200 lb (90.7 kg)   SpO2 98%   BMI 32.28 kg/m?  ? ?Physical Exam ?Constitutional:   ?   Appearance: Normal appearance.  ?HENT:  ?   Head: Normocephalic and atraumatic.  ?Cardiovascular:  ?   Rate and Rhythm: Normal rate.  ?Pulmonary:  ?   Effort: Pulmonary effort is normal. No respiratory distress.  ?Abdominal:  ?   Palpations: Abdomen is soft.  ?   Tenderness: There is no abdominal tenderness.  ?Skin: ?   General: Skin is warm and dry.  ?Neurological:  ?   General: No focal deficit present.  ?   Mental Status: He is alert and oriented to person, place, and time.  ?Psychiatric:     ?   Mood and Affect: Mood normal.     ?   Behavior: Behavior normal.     ?   Thought Content: Thought content normal.     ?   Judgment: Judgment normal.  ?Drain Location: epigastric ?Size: Fr size: 10 Fr ?Date of placement: 11/25/21  ?Currently to: Drain collection device: suction bulb ?24 hour output:  ?Output by Drain (mL) 11/26/21 0701 - 11/26/21 1900 11/26/21 1901 - 11/27/21 0700 11/27/21 0701 - 11/27/21 1900 11/27/21 1901 - 11/28/21 0700 11/28/21 0701 - 11/28/21 1248  ?Closed System Drain 1 Midline RUQ  25  25   ? ?Current examination: ?Flushes/aspirates easily.  ?Insertion site unremarkable. ?Suture and stat lock in place. ?Dressed appropriately.  ? ?Imaging: ?CT ABDOMEN PELVIS W  CONTRAST ? ?Result Date: 11/24/2021 ?CLINICAL DATA:  Insert his EXAM: CT ABDOMEN AND PELVIS WITH CONTRAST TECHNIQUE: Multidetector CT imaging of the abdomen and pelvis was performed using the standard protocol following bolus administration of intravenous contrast. RADIATION DOSE REDUCTION: This exam was performed according to the departmental dose-optimization program which includes automated exposure control, adjustment of the mA and/or kV according to patient size and/or use of iterative reconstruction technique. CONTRAST:  40mL OMNIPAQUE IOHEXOL 350 MG/ML SOLN COMPARISON:  CT 11/12/2021 FINDINGS: Lower chest: Small right pleural  effusion, slightly decreased from prior, with adjacent atelectasis. Hepatobiliary: Along the anterior and inferior surface of the left hepatic lobe, there is a rim enhancing collection measuring 5.4 x 3.4 x 7.1 cm with adjacent inflammatory stranding (series 3, image 31, series 7, image 115). This collection also abuts the anterior body of the stomach. This previously measured up to 2.8 x 2.8 x 3.3 cm (remeasured for consistency). There is adjacent hypoattenuation in the liver parenchyma. The gallbladder is contracted with a notable 2.2 cm stone in the gallbladder fundus. No intrahepatic ductal dilation. Pancreas: Unremarkable. No pancreatic ductal dilatation or surrounding inflammatory changes. Spleen: Normal in size without focal abnormality. Adrenals/Urinary Tract: Adrenal glands are unremarkable. No hydronephrosis or nephrolithiasis. Bladder is unremarkable. Stomach/Bowel: No evidence of bowel obstruction. Prior appendectomy.Lower abdominal surgical drain has been removed since the prior exam. No residual fluid collection in the pelvis. Vascular/Lymphatic: No significant vascular findings are present. No enlarged abdominal or pelvic lymph nodes. Reproductive: Unremarkable. Other: No abdominal wall hernia or abnormality. No abdominopelvic ascites. Removal of prior right gluteal approach drain. Musculoskeletal: Prior right femur fixation, partially visualized. No acute osseous abnormality. IMPRESSION: Anterior epigastric abscess along the anterior and inferior aspect of the left hepatic lobe, measuring 5.4 x 3.4 x 7.1 cm, with adjacent extensive inflammation and abutment of the anterior aspect of the gastric body. Adjacent hypoattenuation in the liver parenchyma, likely reactive. Recent appendectomy with removal of prior lower abdominal surgical drain. No residual fluid collection in the pelvis. Slightly decreased right pleural effusion with adjacent basilar atelectasis in comparison to prior. Electronically  Signed   By: Caprice Renshaw M.D.   On: 11/24/2021 13:45  ? ?CT IMAGE GUIDED DRAINAGE BY PERCUTANEOUS CATHETER ? ?Result Date: 11/25/2021 ?CLINICAL DATA:  History of ruptured appendicitis, appendectomy and previous drainage of pelvic abscess. New abscess has formed in the anterior epigastric region anterior to the stomach and abutting the left lobe of the liver. EXAM: CT GUIDED CATHETER DRAINAGE OF EPIGASTRIC PERITONEAL ABSCESS ANESTHESIA/SEDATION: Moderate (conscious) sedation was employed during this procedure. A total of Versed 5.0 mg and Fentanyl 150 mcg was administered intravenously by radiology nursing. Moderate Sedation Time: 21 minutes. The patient's level of consciousness and vital signs were monitored continuously by radiology nursing throughout the procedure under my direct supervision. PROCEDURE: The procedure, risks, benefits, and alternatives were explained to the patient. Questions regarding the procedure were encouraged and answered. The patient understands and consents to the procedure. A time out was performed prior to initiating the procedure. CT was performed through the upper abdomen in a supine position. The epigastric region was prepped with chlorhexidine in a sterile fashion, and a sterile drape was applied covering the operative field. A sterile gown and sterile gloves were used for the procedure. Local anesthesia was provided with 1% Lidocaine. Under CT guidance, an 18 gauge trocar needle was advanced into an anterior epigastric fluid collection anterior to the stomach and adjacent to the left lobe of the liver. A  small amount of fluid was aspirated. A guidewire was advanced into the collection and the needle removed. The percutaneous tract was dilated and a 10 French percutaneous drainage catheter advanced over the wire. Catheter position was confirmed by CT. Additional fluid sample was aspirated and sent for culture analysis. The drain was flushed with saline and attached to a suction bulb.  The drainage catheter was secured at the skin with a Prolene retention suture and StatLock device. COMPLICATIONS: None FINDINGS: Aspiration at the level of the midline epigastric abscess yielded grossly purulent f

## 2021-11-29 LAB — CULTURE, BLOOD (ROUTINE X 2)
Culture: NO GROWTH
Culture: NO GROWTH

## 2021-11-29 MED ORDER — AMOXICILLIN-POT CLAVULANATE 875-125 MG PO TABS
1.0000 | ORAL_TABLET | Freq: Two times a day (BID) | ORAL | Status: DC
  Administered 2021-11-29 – 2021-11-30 (×3): 1 via ORAL
  Filled 2021-11-29 (×3): qty 1

## 2021-11-29 NOTE — Progress Notes (Signed)
Patient ID: Brad Lyons, male   DOB: 1996-05-09, 26 y.o.   MRN: [id number] ?[facility] Progress Note:   * No surgery found *  ?Subjective: ?Mental status is clear.  Complaints none vocalized. ?Objective: ?Vital signs in last 24 hours: ?Temp:  [97.8 ?F (36.6 ?C)-98.6 ?F (37 ?C)] 97.8 ?F (36.6 ?C) (05/14 0517) ?Pulse Rate:  [75-91] 75 (05/14 0517) ?Resp:  [16-18] 17 (05/14 0517) ?BP: (117-142)/(74-91) 117/82 (05/14 0517) ?SpO2:  [98 %-99 %] 99 % (05/14 0517) ? ?Intake/Output from previous day: ?05/13 0701 - 05/14 0700 ?In: 610 [P.O.:600] ?Out: 32 [Urine:2; Drains:30] ?Intake/Output this shift: ?No intake/output data recorded. ? ?Physical Exam: Work of breathing is normal.  JP drain is greenish and slightly cloudy-being flushed ? ?Lab Results:  ?Results for orders placed or performed during the hospital encounter of 11/24/21 (from the past 48 hour(s))  ?CBC     Status: Abnormal  ? Collection Time: 11/27/21  6:28 PM  ?Result Value Ref Range  ? WBC 19.6 (H) 4.0 - 10.5 K/uL  ?  Comment: REPEATED TO VERIFY  ? RBC 4.23 4.22 - 5.81 MIL/uL  ? Hemoglobin 11.5 (L) 13.0 - 17.0 g/dL  ? HCT 32.6 (L) 39.0 - 52.0 %  ? MCV 77.1 (L) 80.0 - 100.0 fL  ? MCH 27.2 26.0 - 34.0 pg  ? MCHC 35.3 30.0 - 36.0 g/dL  ? RDW 14.3 11.5 - 15.5 %  ? Platelets 568 (H) 150 - 400 K/uL  ?  Comment: REPEATED TO VERIFY  ? nRBC 0.0 0.0 - 0.2 %  ?  Comment: Performed at [facility], [address] [place], [place] [address]  ?Basic metabolic panel     Status: Abnormal  ? Collection Time: 11/27/21  6:28 PM  ?Result Value Ref Range  ? Sodium 136 135 - 145 mmol/L  ? Potassium 3.8 3.5 - 5.1 mmol/L  ? Chloride 98 98 - 111 mmol/L  ? CO2 29 22 - 32 mmol/L  ? Glucose, Bld 113 (H) 70 - 99 mg/dL  ?  Comment: Glucose reference range applies only to samples taken after fasting for at least 8 hours.  ? BUN 5 (L) 6 - 20 mg/dL  ? Creatinine, Ser 1.01 0.61 - 1.24 mg/dL  ? Calcium 8.8 (L) 8.9 - 10.3 mg/dL  ? GFR, Estimated >60 >60 mL/min  ?  Comment:  (NOTE) ?Calculated using the CKD-EPI Creatinine Equation (2021) ?  ? Anion gap 9 5 - 15  ?  Comment: Performed at [facility], [address] [place], [place] [address]  ?CBC     Status: Abnormal  ? Collection Time: 11/28/21  6:44 AM  ?Result Value Ref Range  ? WBC 15.5 (H) 4.0 - 10.5 K/uL  ? RBC 3.92 (L) 4.22 - 5.81 MIL/uL  ? Hemoglobin 10.7 (L) 13.0 - 17.0 g/dL  ? HCT 30.2 (L) 39.0 - 52.0 %  ? MCV 77.0 (L) 80.0 - 100.0 fL  ? MCH 27.3 26.0 - 34.0 pg  ? MCHC 35.4 30.0 - 36.0 g/dL  ? RDW 14.4 11.5 - 15.5 %  ? Platelets 586 (H) 150 - 400 K/uL  ? nRBC 0.0 0.0 - 0.2 %  ?  Comment: Performed at [facility], [address] [place], [place] [address]  ?Basic metabolic panel     Status: Abnormal  ? Collection Time: 11/28/21  6:44 AM  ?Result Value Ref Range  ? Sodium 136 135 - 145 mmol/L  ? Potassium  3.7 3.5 - 5.1 mmol/L  ? Chloride 100 98 - 111 mmol/L  ? CO2 28 22 - 32 mmol/L  ? Glucose, Bld 97 70 - 99 mg/dL  ?  Comment: Glucose reference range applies only to samples taken after fasting for at least 8 hours.  ? BUN <5 (L) 6 - 20 mg/dL  ? Creatinine, Ser 0.95 0.61 - 1.24 mg/dL  ? Calcium 8.6 (L) 8.9 - 10.3 mg/dL  ? GFR, Estimated >60 >60 mL/min  ?  Comment: (NOTE) ?Calculated using the CKD-EPI Creatinine Equation (2021) ?  ? Anion gap 8 5 - 15  ?  Comment: Performed at [facility], [address] [place], [place] [address]  ? ? ?Radiology/Results: ?No results found. ? ?Anti-infectives: ?Anti-infectives (From admission, onward)  ? ? Start     Dose/Rate Route Frequency Ordered Stop  ? 11/29/21 1000  amoxicillin-clavulanate (AUGMENTIN) 875-125 MG per tablet 1 tablet       ? 1 tablet Oral Every 12 hours 11/29/21 0816    ? 11/24/21 1545  piperacillin-tazobactam (ZOSYN) IVPB 3.375 g  Status:  Discontinued       ? 3.375 g ?12.5 mL/hr over 240 Minutes Intravenous Every 8 hours 11/24/21 1541 11/29/21 0816  ? 11/24/21 1200  ceFEPIme (MAXIPIME) 2 g in sodium chloride 0.9 % 100 mL IVPB  Status:  Discontinued        ? 2 g ?200 mL/hr over 30 Minutes Intravenous Every 8 hours 11/24/21 1149 11/24/21 1532  ? 11/24/21 1130  ceFEPIme (MAXIPIME) 2 g in sodium chloride 0.9 % 100 mL IVPB  Status:  Discontinued       ? 2 g ?200 mL/hr over 30 Minutes Intravenous Every 12 hours 11/24/21 1129 11/24/21 1149  ? 11/24/21 1030  cefTRIAXone (ROCEPHIN) 2 g in sodium chloride 0.9 % 100 mL IVPB  Status:  Discontinued       ? 2 g ?200 mL/hr over 30 Minutes Intravenous  Once 11/24/21 1023 11/24/21 1126  ? 11/24/21 1030  metroNIDAZOLE (FLAGYL) IVPB 500 mg       ? 500 mg ?100 mL/hr over 60 Minutes Intravenous  Once 11/24/21 1023 11/24/21 1547  ? ?  ? ? ?Assessment/Plan: ?Problem List: ?Patient Active Problem List  ? Diagnosis Date Noted  ? Intra-abdominal fluid collection 11/24/2021  ? Acute perforated appendicitis 10/25/2021  ? ? ?Will advance diet and switch from Zosyn to Augmentin.   ?* No surgery found *  ? ? LOS: 5 days  ? ?Matt B. Daphine Deutscher, MD, FACS ? ?[facility] ?[phone] to reach the surgeon on call.   ? ?11/29/2021 8:17 AM  ?

## 2021-11-29 NOTE — Plan of Care (Signed)
  Problem: Education: Goal: Knowledge of General Education information will improve Description Including pain rating scale, medication(s)/side effects and non-pharmacologic comfort measures Outcome: Progressing   Problem: Health Behavior/Discharge Planning: Goal: Ability to manage health-related needs will improve Outcome: Progressing   

## 2021-11-29 NOTE — Progress Notes (Signed)
Called surgeon on call about loss of IV access. She stated that patient did not need an IV. ?

## 2021-11-29 NOTE — Progress Notes (Deleted)
Patient arrived back to room. Transferred to bed. IV fluids restarted. Water provided. Call bell within reach. Patient resting comfortably. ?

## 2021-11-30 ENCOUNTER — Other Ambulatory Visit (HOSPITAL_COMMUNITY): Payer: Self-pay

## 2021-11-30 MED ORDER — AMOXICILLIN-POT CLAVULANATE 875-125 MG PO TABS
1.0000 | ORAL_TABLET | Freq: Two times a day (BID) | ORAL | 0 refills | Status: DC
  Filled 2021-11-30: qty 10, 5d supply, fill #0

## 2021-11-30 MED ORDER — OXYCODONE HCL 5 MG PO TABS
5.0000 mg | ORAL_TABLET | Freq: Four times a day (QID) | ORAL | 0 refills | Status: DC | PRN
  Filled 2021-11-30: qty 15, 4d supply, fill #0

## 2021-11-30 MED ORDER — SODIUM CHLORIDE 0.9% FLUSH
5.0000 mL | Freq: Every day | INTRAVENOUS | 0 refills | Status: DC
  Filled 2021-11-30: qty 125, 25d supply, fill #0

## 2021-11-30 NOTE — Discharge Summary (Signed)
. ? ? ?Patient ID: ?Brad Lyons ?[id number] ?04-May-1996 26 y.o. ? ?Admit date: 11/24/2021 ?Discharge date: 11/30/2021 ? ?Discharge Diagnosis ?Intra-abdominal abscess after lap appy for perforated appendicitis on 4/9 by Dr. Royanne Foots ? ?Consultants ?IR ? ?H&P ?This is a pleasant 26 yo black male with no significant PMH who was just recently admitted secondary to a perforated appendicitis.  He underwent lap appy by Dr. Dossie Der on 4/9 with purulent peritonitis, fecalith, and perforation.  He had a surgical drain placed at the time of surgery.  He developed a post op ileus which resolved, but he also developed a post op pelvic abscess.  This was drained by IR.  His surgical drain was removed prior to DC but his IR drain remained in place.  He was discharged on 4/18.  He returned to the IR clinic on 4/27 with a repeat CT scan showing resolution of this pelvic collection and his drain was pulled.  Incidentally he was noted to have soft tissue stranding in the epigastric region at that time but no other findings or concerns that warranted follow up. ?  ?He has been doing well since that time until 2 days ago.  He developed epigastric abdominal pain, N,V, subjective fevers, chest pain, and pain with breathing at times.  He does not recall the last time he had a BM, but states it was after taking a laxative.  He denies diarrhea.  His pain continued to worsen along with his general malaise.  He presented back to the [facility] today where he was noted to be tachycardic, febrile up to 102.9, WBC of 22K, but normotensive, with a CT scan showing evidence of a moderate-sized fluid collection anterior to the gastric body.  We have been asked to see for admission and management. ? ?Procedures ?Dr. Fredia Sorrow - CT Guided Drainage of abdominal abscess - 11/25/2021 ? ?Hospital Course:  ?Patient originally admitted 4/8 and taken to the OR for lap appy by Dr. Dossie Der on 4/9 with purulent peritonitis, fecalith, and perforation. He  developed a post op pelvic abscess that was drained by IR.  His surgical drain was left in place at DC on 4/18.  He returned to the IR clinic on 4/27 with a repeat CT scan showing resolution of this pelvic collection and his drain was pulled.  Incidentally he was noted to have soft tissue stranding in the epigastric region at that time but no other findings or concerns that warranted follow up. He presented as above on 5/9 with 2 d hx of pain, tachycardia, febrile up to 102.9, WBC of 22K, normotensive and CT scan showing evidence of a moderate-sized fluid collection anterior to the gastric body. He underwent IR drainage 5/10 w/ return of purulent fluid. He gradually improved and diet was advanced as tolerated. Cultures with strep constellatus, PCN sensative. Patient notes output has turned yellow/red in the last 24 hours. On 5/15, the patient was voiding well, tolerating diet, having bowel function, ambulating well, pain well controlled, vital signs stable, drain SS and felt stable for discharge home. Discharged on Augmentin. Contacted IR PA before d/c to arrange follow up. Follow up with Dr. Dossie Der as noted below. Discussed discharge instructions, restrictions and return/call back precautions  ? ?Physical Exam: ?Gen:  Alert, NAD, pleasant ?Card:  RRR ?Pulm:  CTAB, no W/R/R, effort normal ?Abd: Soft, NT, ND, +BS, IR drain with scant SS output today ?Ext:  No LE edema  ?Psych: A&Ox3  ?Skin: no rashes noted, warm and dry ? ?Allergies as of  11/30/2021   ?No Known Allergies ?  ? ?  ?Medication List  ?  ? ?TAKE these medications   ? ?acetaminophen 500 MG tablet ?Commonly known as: TYLENOL ?Take 500-1,000 mg by mouth every 6 (six) hours as needed for mild pain or headache (Tooth ache). ?  ?amoxicillin-clavulanate 875-125 MG tablet ?Commonly known as: AUGMENTIN ?Take 1 tablet by mouth every 12 (twelve) hours. ?  ?bismuth subsalicylate 262 MG chewable tablet ?Commonly known as: PEPTO BISMOL ?Chew 262-524 mg by mouth as  needed for indigestion. ?  ?ondansetron 4 MG tablet ?Commonly known as: ZOFRAN ?Take 1 tablet (4 mg total) by mouth every 8 (eight) hours as needed for nausea or vomiting. ?  ?oxyCODONE 5 MG immediate release tablet ?Commonly known as: Oxy IR/ROXICODONE ?Take 1 tablet (5 mg total) by mouth every 6 (six) hours as needed for breakthrough pain. ?  ?polyethylene glycol powder 17 GM/SCOOP powder ?Commonly known as: GLYCOLAX/MIRALAX ?Take 17 g by mouth daily as needed for mild constipation. ?  ?sodium chloride flush 0.9 % Soln ?Commonly known as: NS ?5 mLs by Intracatheter route daily. ?  ? ?  ? ? ? ? Follow-up Information   ? ? [facility] Follow up.   ?Why: December 28, 2021 at 2:30 pm ?Contact information: ?[address] ?[place] [address] ?[phone] ? ?  ?  ? ? Stechschulte, Hyman Hopes, MD Follow up.   ?Specialty: Surgery ?Why: Please call to confirm your appointment date/time ?Contact information: ?[address] ?[address] ?[place] [address] ?[phone] ? ? ?  ?  ? ? Irish Lack, MD. Schedule an appointment as soon as possible for a visit.   ?Specialties: Interventional Radiology, Radiology ?Why: For follow up ?Contact information: ?[address] ?[address] ?[place] [address] ?[phone] ? ? ?  ?  ? ?  ?  ? ?  ? ? ?Signed: ?Leary Roca, PA-C ?[facility] ?11/30/2021, 9:36 AM ?Please see Amion for pager number during day hours 7:00am-4:30pm ? ?

## 2021-11-30 NOTE — Discharge Instructions (Signed)
Per IR you will need to flush drain daily with 5 cc normal saline. Record output daily. Change dressing every 2-3 days or earlier if soiled.  ? ?Call with any fever, persistent nausea, vomiting, new/worsening abdominal pain, problems with drain or any other concerns/questions.  ?

## 2021-11-30 NOTE — Progress Notes (Signed)
Pt discharged home in stable condition. Dressing change supplies for drain supplied along with the flushes before leaving ?

## 2021-12-03 NOTE — Progress Notes (Signed)
The abscess is related to the prior appendicitis  not the surgery

## 2021-12-10 ENCOUNTER — Other Ambulatory Visit: Payer: Self-pay | Admitting: Surgery

## 2021-12-10 DIAGNOSIS — R188 Other ascites: Secondary | ICD-10-CM

## 2021-12-10 DIAGNOSIS — L0291 Cutaneous abscess, unspecified: Secondary | ICD-10-CM

## 2021-12-17 ENCOUNTER — Encounter: Payer: Self-pay | Admitting: *Deleted

## 2021-12-17 ENCOUNTER — Ambulatory Visit
Admission: RE | Admit: 2021-12-17 | Discharge: 2021-12-17 | Disposition: A | Discharge status: Home / Self Care | Payer: Self-pay | Source: Ambulatory Visit | Attending: Surgery | Admitting: Surgery

## 2021-12-17 ENCOUNTER — Ambulatory Visit
Admission: RE | Admit: 2021-12-17 | Discharge: 2021-12-17 | Disposition: A | Discharge status: Home / Self Care | Payer: Self-pay | Source: Ambulatory Visit | Attending: Physician Assistant | Admitting: Physician Assistant

## 2021-12-17 DIAGNOSIS — R188 Other ascites: Secondary | ICD-10-CM

## 2021-12-17 DIAGNOSIS — L0291 Cutaneous abscess, unspecified: Secondary | ICD-10-CM

## 2021-12-17 HISTORY — PX: IR RADIOLOGIST EVAL & MGMT: IMG5224

## 2021-12-17 MED ORDER — IOPAMIDOL (ISOVUE-300) INJECTION 61%
100.0000 mL | Freq: Once | INTRAVENOUS | Status: AC | PRN
  Administered 2021-12-17: 100 mL via INTRAVENOUS

## 2021-12-17 NOTE — Progress Notes (Signed)
Chief Complaint: The patient is seen in follow up today s/p intra-abdominal abscess drainage catheter placement on 11/25/21 after lap appy on 10/25/21.  History of present illness: Mr. Brad Lyons was seen at [facility] for intra-abdominal abscess in the epigastric region one month after laparscopic appendectomy for perforated appendicitis.  His hospital course was uncomplicated.  He was treated with antibiotics and was stable to discharge home on 11/30/21.  He has continued flushing his drain 3 times daily and no longer has any output from the catheter.  He denies fever or chills, nausea or vomiting.  His pain is limited to the insertion site.  He continues to take his antibiotic and believes he is greatly improved.  He has upcoming surgical follow up with Dr. Dossie Der.  Past Medical History:  Diagnosis Date   GERD (gastroesophageal reflux disease)     Past Surgical History:  Procedure Laterality Date   IR RADIOLOGIST EVAL & MGMT  11/12/2021   LAPAROSCOPIC APPENDECTOMY  10/25/2021   Procedure: APPENDECTOMY LAPAROSCOPIC;  Surgeon: Quentin Ore, MD;  Location: [facility] OR;  Service: General;;   LAPAROSCOPY N/A 10/25/2021   Procedure: LAPAROSCOPY DIAGNOSTIC;  Surgeon: Quentin Ore, MD;  Location: [facility] OR;  Service: General;  Laterality: N/A;    Allergies: Patient has no known allergies.  Medications: Prior to Admission medications   Medication Sig Start Date End Date Taking? Authorizing Provider  acetaminophen (TYLENOL) 500 MG tablet Take 500-1,000 mg by mouth every 6 (six) hours as needed for mild pain or headache (Tooth ache).    [provider]  amoxicillin-clavulanate (AUGMENTIN) 875-125 MG tablet Take 1 tablet by mouth every 12 (twelve) hours. 11/30/21   Maczis, Elmer Sow, PA-C  bismuth subsalicylate (PEPTO BISMOL) 262 MG chewable tablet Chew 262-524 mg by mouth as needed for indigestion.    [provider]  ondansetron (ZOFRAN) 4 MG tablet Take 1 tablet (4 mg  total) by mouth every 8 (eight) hours as needed for nausea or vomiting. 10/21/21   Meccariello, Solmon Ice, DO  oxyCODONE (OXY IR/ROXICODONE) 5 MG immediate release tablet Take 1 tablet (5 mg total) by mouth every 6 (six) hours as needed for breakthrough pain. 11/30/21   Maczis, Elmer Sow, PA-C  polyethylene glycol powder (GLYCOLAX/MIRALAX) 17 GM/SCOOP powder Take 17 g by mouth daily as needed for mild constipation.    [provider]  sodium chloride flush (NS) 0.9 % SOLN 5 mLs by Intracatheter route daily. 11/30/21   Maczis, Elmer Sow, PA-C     Family History  Problem Relation Age of Onset   Healthy Father     Social History   Socioeconomic History   Marital status: Single    Spouse name: Not on file   Number of children: Not on file   Years of education: Not on file   Highest education level: Not on file  Occupational History   Not on file  Tobacco Use   Smoking status: Never   Smokeless tobacco: Never  Vaping Use   Vaping Use: Every day  Substance and Sexual Activity   Alcohol use: Yes    Comment: occasionally   Drug use: Yes    Types: Marijuana   Sexual activity: Not on file  Other Topics Concern   Not on file  Social History Narrative   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  Physical Exam Constitutional:      General: He is not in acute distress.    Appearance: He is not ill-appearing.  HENT:     Head: Normocephalic and atraumatic.     Mouth/Throat:     Pharynx: Oropharynx is clear.  Eyes:     Extraocular Movements: Extraocular movements intact.  Pulmonary:     Effort: Pulmonary effort is normal.  Abdominal:     General: Abdomen is flat. There is no  distension.     Palpations: Abdomen is soft.     Tenderness: There is no abdominal tenderness.  Skin:    General: Skin is warm and dry.  Neurological:     General: No focal deficit present.     Mental Status: He is alert and oriented to person, place, and time.  Psychiatric:        Mood and Affect: Mood normal.        Behavior: Behavior normal.   Drain catheter is in place and suture intact.  The site is not dressed, but is clean and dry.  No erythema, skin breakdown, or drainage around catheter.  There is scant serous fluid in the bulb.    Imaging: No results found.  Labs:  CBC: Recent Labs    11/25/21 0213 11/26/21 0843 11/27/21 1828 11/28/21 0644  WBC 18.1* 22.5* 19.6* 15.5*  HGB 10.6* 10.9* 11.5* 10.7*  HCT 30.3* 31.7* 32.6* 30.2*  PLT 514* 389 568* 586*    COAGS: Recent Labs    11/01/21 1554 11/24/21 1029  INR 1.1 1.2  APTT  --  39*    BMP: Recent Labs    11/24/21 1029 11/25/21 0213 11/27/21 1828 11/28/21 0644  NA 134* 136 136 136  K 3.8 3.7 3.8 3.7  CL 99 101 98 100  CO2 21* 27 29 28   GLUCOSE 104* 124* 113* 97  BUN 6 5* 5* <5*  CALCIUM 9.6 8.8* 8.8* 8.6*  CREATININE 1.07 1.14 1.01 0.95  GFRNONAA >60 >60 >60 >60    LIVER FUNCTION TESTS: Recent Labs    10/25/21 0628 11/24/21 1029 11/25/21 0213  BILITOT 1.9* 1.4* 1.1  AST 21 35 30  ALT 26 41 41  ALKPHOS 96 121 109  PROT 8.6* 8.8* 7.2  ALBUMIN 3.7 3.0* 2.4*    Assessment:  Intra-abdominal abscess --s/p perforated appendicitis, lap appy, and abscess drain --CT shows resolution of fluid collection and he presents in a well state --drain was removed without incident today --advised to finish antibiotic and keep surgical follow up appointment.   Signed07/10/23, PA 12/17/2021, 2:31 PM   Please refer to Dr. 02/16/2022 attestation of this note for management and plan.

## 2021-12-28 ENCOUNTER — Inpatient Hospital Stay: Payer: Self-pay | Admitting: Internal Medicine

## 2022-04-21 ENCOUNTER — Ambulatory Visit (HOSPITAL_COMMUNITY)
Admission: EM | Admit: 2022-04-21 | Discharge: 2022-04-21 | Disposition: A | Discharge status: Home / Self Care | Payer: Self-pay

## 2022-04-21 ENCOUNTER — Encounter (HOSPITAL_COMMUNITY): Payer: Self-pay

## 2022-04-21 DIAGNOSIS — R197 Diarrhea, unspecified: Secondary | ICD-10-CM

## 2022-04-21 DIAGNOSIS — Z0289 Encounter for other administrative examinations: Secondary | ICD-10-CM

## 2022-04-21 NOTE — Discharge Instructions (Addendum)
Eat a bland diet over the next 12-24 hours to allow your stomach to heal from diarrhea episode this morning. Drink at least 64 ounces of water per day to stay well hydrated.   If you develop any new or worsening symptoms or do not improve in the next 2 to 3 days, please return.  If your symptoms are severe, please go to the emergency room.  Follow-up with your primary care provider for further evaluation and management of your symptoms as well as ongoing wellness visits.  I hope you feel better!

## 2022-04-21 NOTE — ED Triage Notes (Signed)
Pt reports stomach pain and diarrhea this morning. Pt reports feeling better but will need a doctors note to returned to work.

## 2022-04-21 NOTE — ED Provider Notes (Signed)
[facility]    CSN: [id number] Arrival date & time: 04/21/22  1421      History   Chief Complaint No chief complaint on file.   HPI Brad Lyons is a 26 y.o. male.   Patient presents urgent care for evaluation after 1 episode of diarrhea this morning.  No blood or mucus to the stool.  States he ate at a food truck near his apartment last night and believes that this is the cause of diarrhea.  States he feels "completely fine" and a lot better since this morning.  No nausea, vomiting, abdominal pain, fever/chills, back pain, decreased appetite, or dizziness.      Past Medical History:  Diagnosis Date   GERD (gastroesophageal reflux disease)     Patient Active Problem List   Diagnosis Date Noted   Intra-abdominal fluid collection 11/24/2021   Acute perforated appendicitis 10/25/2021    Past Surgical History:  Procedure Laterality Date   IR RADIOLOGIST EVAL & MGMT  11/12/2021   IR RADIOLOGIST EVAL & MGMT  12/17/2021   LAPAROSCOPIC APPENDECTOMY  10/25/2021   Procedure: APPENDECTOMY LAPAROSCOPIC;  Surgeon: Quentin Ore, MD;  Location: [facility];  Service: General;;   LAPAROSCOPY N/A 10/25/2021   Procedure: LAPAROSCOPY DIAGNOSTIC;  Surgeon: Quentin Ore, MD;  Location: [facility];  Service: General;  Laterality: N/A;       Home Medications    Prior to Admission medications   Medication Sig Start Date End Date Taking? Authorizing Provider  acetaminophen (TYLENOL) 500 MG tablet Take 500-1,000 mg by mouth every 6 (six) hours as needed for mild pain or headache (Tooth ache).    [provider]  amoxicillin-clavulanate (AUGMENTIN) 875-125 MG tablet Take 1 tablet by mouth every 12 (twelve) hours. 11/30/21   Maczis, Elmer Sow, PA-C  bismuth subsalicylate (PEPTO BISMOL) 262 MG chewable tablet Chew 262-524 mg by mouth as needed for indigestion.    [provider]  ondansetron (ZOFRAN) 4 MG tablet Take 1 tablet (4 mg total) by mouth every 8 (eight)  hours as needed for nausea or vomiting. 10/21/21   Meccariello, Solmon Ice, MD  oxyCODONE (OXY IR/ROXICODONE) 5 MG immediate release tablet Take 1 tablet (5 mg total) by mouth every 6 (six) hours as needed for breakthrough pain. 11/30/21   Maczis, Elmer Sow, PA-C  polyethylene glycol powder (GLYCOLAX/MIRALAX) 17 GM/SCOOP powder Take 17 g by mouth daily as needed for mild constipation.    [provider]  sodium chloride flush (NS) 0.9 % SOLN 5 mLs by Intracatheter route daily. 11/30/21   Maczis, Elmer Sow, PA-C    Family History Family History  Problem Relation Age of Onset   Healthy Father     Social History Social History   Tobacco Use   Smoking status: Never   Smokeless tobacco: Never  Vaping Use   Vaping Use: Every day  Substance Use Topics   Alcohol use: Yes    Comment: occasionally   Drug use: Yes    Types: Marijuana     Allergies   Patient has no known allergies.   Review of Systems Review of Systems Per HPI  Physical Exam Triage Vital Signs ED Triage Vitals [04/21/22 1609]  Enc Vitals Group     BP 131/82     Pulse Rate 87     Resp 18     Temp 98.4 F (36.9 C)     Temp Source Oral     SpO2 98 %     Weight  Height      Head Circumference      Peak Flow      Pain Score      Pain Loc      Pain Edu?      Excl. in [place]?    No data found.  Updated Vital Signs BP 131/82 (BP Location: Left Arm)   Pulse 87   Temp 98.4 F (36.9 C) (Oral)   Resp 18   SpO2 98%   Visual Acuity Right Eye Distance:   Left Eye Distance:   Bilateral Distance:    Right Eye Near:   Left Eye Near:    Bilateral Near:     Physical Exam Vitals and nursing note reviewed.  Constitutional:      Appearance: He is not ill-appearing or toxic-appearing.  HENT:     Head: Normocephalic and atraumatic.     Right Ear: Hearing and external ear normal.     Left Ear: Hearing and external ear normal.     Nose: Nose normal.     Mouth/Throat:     Lips: Pink.  Eyes:      General: Lids are normal. Vision grossly intact. Gaze aligned appropriately.     Extraocular Movements: Extraocular movements intact.     Conjunctiva/sclera: Conjunctivae normal.  Pulmonary:     Effort: Pulmonary effort is normal.  Abdominal:     General: Bowel sounds are normal.     Palpations: Abdomen is soft.     Tenderness: There is no abdominal tenderness. There is no right CVA tenderness, left CVA tenderness or guarding.  Musculoskeletal:     Cervical back: Neck supple.  Skin:    General: Skin is warm and dry.     Capillary Refill: Capillary refill takes less than 2 seconds.     Findings: No rash.  Neurological:     General: No focal deficit present.     Mental Status: He is alert and oriented to person, place, and time. Mental status is at baseline.     Cranial Nerves: No dysarthria or facial asymmetry.  Psychiatric:        Mood and Affect: Mood normal.        Speech: Speech normal.        Behavior: Behavior normal.        Thought Content: Thought content normal.        Judgment: Judgment normal.      UC Treatments / Results  Labs (all labs ordered are listed, but only abnormal results are displayed) Labs Reviewed - No data to display  EKG   Radiology No results found.  Procedures Procedures (including critical care time)  Medications Ordered in UC Medications - No data to display  Initial Impression / Assessment and Plan / UC Course  I have reviewed the triage vital signs and the nursing notes.  Pertinent labs & imaging results that were available during my care of the patient were reviewed by me and considered in my medical decision making (see chart for details).   1.  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  He is currently asymptomatic, well-appearing, and has hemodynamically stable vital signs.  Advised patient to return to urgent care if he experiences any new or worsening  symptoms related to episode of diarrhea this morning.  He does not appear to be dehydrated and has been advised to eat a bland diet for the next 12  to 24 hours and increase fluid intake to stay well-hydrated.  He expresses agreement with this plan.   Discussed physical exam and available lab work findings in clinic with patient.  Counseled patient regarding appropriate use of medications and potential side effects for all medications recommended or prescribed today. Discussed red flag signs and symptoms of worsening condition,when to call the PCP office, return to urgent care, and when to seek higher level of care in the emergency department. Patient verbalizes understanding and agreement with plan. All questions answered. Patient discharged in stable condition.    Final Clinical Impressions(s) / UC Diagnoses   Final diagnoses:  Diarrhea, unspecified type  Encounter to obtain excuse from work     Discharge Instructions      Eat a bland diet over the next 12-24 hours to allow your stomach to heal from diarrhea episode this morning. Drink at least 64 ounces of water per day to stay well hydrated.   If you develop any new or worsening symptoms or do not improve in the next 2 to 3 days, please return.  If your symptoms are severe, please go to the emergency room.  Follow-up with your primary care provider for further evaluation and management of your symptoms as well as ongoing wellness visits.  I hope you feel better!   ED Prescriptions   None    PDMP not reviewed this encounter.   Carlisle Beers, [place] 04/21/22 1630

## 2022-06-28 ENCOUNTER — Emergency Department (HOSPITAL_COMMUNITY): Payer: Self-pay

## 2022-06-28 ENCOUNTER — Other Ambulatory Visit: Payer: Self-pay

## 2022-06-28 ENCOUNTER — Emergency Department (HOSPITAL_COMMUNITY)
Admission: EM | Admit: 2022-06-28 | Discharge: 2022-06-28 | Disposition: A | Discharge status: Home / Self Care | Payer: Self-pay | Attending: Student | Admitting: Student

## 2022-06-28 ENCOUNTER — Encounter (HOSPITAL_COMMUNITY): Payer: Self-pay

## 2022-06-28 DIAGNOSIS — R109 Unspecified abdominal pain: Secondary | ICD-10-CM

## 2022-06-28 DIAGNOSIS — R1031 Right lower quadrant pain: Secondary | ICD-10-CM | POA: Insufficient documentation

## 2022-06-28 DIAGNOSIS — Z1152 Encounter for screening for COVID-19: Secondary | ICD-10-CM | POA: Insufficient documentation

## 2022-06-28 DIAGNOSIS — R112 Nausea with vomiting, unspecified: Secondary | ICD-10-CM

## 2022-06-28 DIAGNOSIS — R1032 Left lower quadrant pain: Secondary | ICD-10-CM | POA: Insufficient documentation

## 2022-06-28 LAB — CBC
HCT: 49.5 % (ref 39.0–52.0)
Hemoglobin: 17.4 g/dL — ABNORMAL HIGH (ref 13.0–17.0)
MCH: 28.8 pg (ref 26.0–34.0)
MCHC: 35.2 g/dL (ref 30.0–36.0)
MCV: 82 fL (ref 80.0–100.0)
Platelets: 292 10*3/uL (ref 150–400)
RBC: 6.04 MIL/uL — ABNORMAL HIGH (ref 4.22–5.81)
RDW: 13.1 % (ref 11.5–15.5)
WBC: 15.6 10*3/uL — ABNORMAL HIGH (ref 4.0–10.5)
nRBC: 0 % (ref 0.0–0.2)

## 2022-06-28 LAB — COMPREHENSIVE METABOLIC PANEL
ALT: 26 U/L (ref 0–44)
AST: 18 U/L (ref 15–41)
Albumin: 4.1 g/dL (ref 3.5–5.0)
Alkaline Phosphatase: 85 U/L (ref 38–126)
Anion gap: 10 (ref 5–15)
BUN: 9 mg/dL (ref 6–20)
CO2: 22 mmol/L (ref 22–32)
Calcium: 9.3 mg/dL (ref 8.9–10.3)
Chloride: 103 mmol/L (ref 98–111)
Creatinine, Ser: 1.18 mg/dL (ref 0.61–1.24)
GFR, Estimated: >60 mL/min (ref >60)
Glucose, Bld: 114 mg/dL — ABNORMAL HIGH (ref 70–99)
Potassium: 3.8 mmol/L (ref 3.5–5.1)
Sodium: 135 mmol/L (ref 135–145)
Total Bilirubin: 1 mg/dL (ref 0.3–1.2)
Total Protein: 7.2 g/dL (ref 6.5–8.1)

## 2022-06-28 LAB — URINALYSIS, ROUTINE W REFLEX MICROSCOPIC
Bilirubin Urine: NEGATIVE
Glucose, UA: NEGATIVE mg/dL
Hgb urine dipstick: NEGATIVE
Ketones, ur: NEGATIVE mg/dL
Leukocytes,Ua: NEGATIVE
Nitrite: NEGATIVE
Protein, ur: NEGATIVE mg/dL
Specific Gravity, Urine: >1.046 — ABNORMAL HIGH (ref 1.005–1.030)
pH: 6 (ref 5.0–8.0)

## 2022-06-28 LAB — RESP PANEL BY RT-PCR (RSV, FLU A&B, COVID)  RVPGX2
Influenza A by PCR: NEGATIVE
Influenza B by PCR: NEGATIVE
Resp Syncytial Virus by PCR: NEGATIVE
SARS Coronavirus 2 by RT PCR: NEGATIVE

## 2022-06-28 LAB — LIPASE, BLOOD: Lipase: 27 U/L (ref 11–51)

## 2022-06-28 MED ORDER — IOHEXOL 350 MG/ML SOLN
75.0000 mL | Freq: Once | INTRAVENOUS | Status: AC | PRN
  Administered 2022-06-28: 75 mL via INTRAVENOUS

## 2022-06-28 MED ORDER — DICYCLOMINE HCL 10 MG PO CAPS
20.0000 mg | ORAL_CAPSULE | Freq: Once | ORAL | Status: AC
  Administered 2022-06-28: 20 mg via ORAL
  Filled 2022-06-28: qty 2

## 2022-06-28 MED ORDER — KETOROLAC TROMETHAMINE 15 MG/ML IJ SOLN
15.0000 mg | Freq: Once | INTRAMUSCULAR | Status: AC
  Administered 2022-06-28: 15 mg via INTRAVENOUS
  Filled 2022-06-28: qty 1

## 2022-06-28 MED ORDER — ONDANSETRON 4 MG PO TBDP: 4.0000 mg | ORAL_TABLET | Freq: Three times a day (TID) | ORAL | 0 refills | Status: DC | PRN

## 2022-06-28 MED ORDER — LACTATED RINGERS IV BOLUS
1000.0000 mL | Freq: Once | INTRAVENOUS | Status: AC
  Administered 2022-06-28: 1000 mL via INTRAVENOUS

## 2022-06-28 MED ORDER — DICYCLOMINE HCL 20 MG PO TABS: 20.0000 mg | ORAL_TABLET | Freq: Two times a day (BID) | ORAL | 0 refills | Status: DC

## 2022-06-28 NOTE — ED Notes (Signed)
Discharge instructions reviewed with patient. Patient denies any questions or concerns. Patient ambulatory out to lobby. 

## 2022-06-28 NOTE — ED Triage Notes (Signed)
Pt arrived POV from home c/o generalized abdominal pain that started yesterday that comes and goes. Pt also endorses N/V.

## 2022-06-28 NOTE — ED Provider Notes (Signed)
[facility] EMERGENCY DEPARTMENT Provider Note  CSN: [id number] Arrival date & time: 06/28/22 0746  Chief Complaint(s) Abdominal Pain  HPI Brad Lyons is a 26 y.o. male who presents emergency department for evaluation of abdominal pain, nausea, vomiting, diarrhea.  States that pain began yesterday and is intermittent in nature.  Denies hematemesis or hematochezia.  Endorses f chills and myalgias but denies documented fever at home.  No sick contacts at home.  Pain appears to be worse in the right and left lower quadrants.  Denies chest pain, shortness of breath, headache or other systemic symptoms.   Past Medical History Past Medical History:  Diagnosis Date   GERD (gastroesophageal reflux disease)    Patient Active Problem List   Diagnosis Date Noted   Intra-abdominal fluid collection 11/24/2021   Acute perforated appendicitis 10/25/2021   Home Medication(s) Prior to Admission medications   Medication Sig Start Date End Date Taking? Authorizing Provider  acetaminophen (TYLENOL) 500 MG tablet Take 500-1,000 mg by mouth every 6 (six) hours as needed for mild pain or headache (Tooth ache).    [provider]  amoxicillin-clavulanate (AUGMENTIN) 875-125 MG tablet Take 1 tablet by mouth every 12 (twelve) hours. 11/30/21   Maczis, Elmer Sow, PA-C  bismuth subsalicylate (PEPTO BISMOL) 262 MG chewable tablet Chew 262-524 mg by mouth as needed for indigestion.    [provider]  ondansetron (ZOFRAN) 4 MG tablet Take 1 tablet (4 mg total) by mouth every 8 (eight) hours as needed for nausea or vomiting. 10/21/21   Meccariello, Solmon Ice, MD  oxyCODONE (OXY IR/ROXICODONE) 5 MG immediate release tablet Take 1 tablet (5 mg total) by mouth every 6 (six) hours as needed for breakthrough pain. 11/30/21   Maczis, Elmer Sow, PA-C  polyethylene glycol powder (GLYCOLAX/MIRALAX) 17 GM/SCOOP powder Take 17 g by mouth daily as needed for mild constipation.    [provider]  sodium chloride flush (NS) 0.9 % SOLN 5 mLs by Intracatheter route daily. 11/30/21   Maczis, Elmer Sow, PA-C                                                                                                                                    Past Surgical History Past Surgical History:  Procedure Laterality Date   IR RADIOLOGIST EVAL & MGMT  11/12/2021   IR RADIOLOGIST EVAL & MGMT  12/17/2021   LAPAROSCOPIC APPENDECTOMY  10/25/2021   Procedure: APPENDECTOMY LAPAROSCOPIC;  Surgeon: Quentin Ore, MD;  Location: [facility];  Service: General;;   LAPAROSCOPY N/A 10/25/2021   Procedure: LAPAROSCOPY DIAGNOSTIC;  Surgeon: Quentin Ore, MD;  Location: [facility];  Service: General;  Laterality: N/A;   Family History Family History  Problem Relation Age of Onset   Healthy Father     Social History Social History   Tobacco Use   Smoking status: Never   Smokeless tobacco: Never  Vaping Use  Vaping Use: Every day  Substance Use Topics   Alcohol use: Yes    Comment: occasionally   Drug use: Yes    Types: Marijuana   Allergies Patient has no known allergies.  Review of Systems Review of Systems  Gastrointestinal:  Positive for abdominal pain, diarrhea, nausea and vomiting.    Physical Exam Vital Signs  I have reviewed the triage vital signs BP (!) 147/95   Pulse (!) 115   Temp 98 F (36.7 C) (Oral)   Resp 16   Ht 5\' 7"  (1.702 m)   Wt 113.4 kg   SpO2 97%   BMI 39.16 kg/m   Physical Exam Constitutional:      General: He is not in acute distress.    Appearance: Normal appearance.  HENT:     Head: Normocephalic and atraumatic.     Nose: No congestion or rhinorrhea.  Eyes:     General:        Right eye: No discharge.        Left eye: No discharge.     Extraocular Movements: Extraocular movements intact.     Pupils: Pupils are equal, round, and reactive to light.  Cardiovascular:     Rate and Rhythm: Normal rate and regular rhythm.     Heart sounds:  No murmur heard. Pulmonary:     Effort: No respiratory distress.     Breath sounds: No wheezing or rales.  Abdominal:     General: There is no distension.     Tenderness: There is abdominal tenderness in the right lower quadrant and left lower quadrant.  Musculoskeletal:        General: Normal range of motion.     Cervical back: Normal range of motion.  Skin:    General: Skin is warm and dry.  Neurological:     General: No focal deficit present.     Mental Status: He is alert.     ED Results and Treatments Labs (all labs ordered are listed, but only abnormal results are displayed) Labs Reviewed  COMPREHENSIVE METABOLIC PANEL - Abnormal; Notable for the following components:      Result Value   Glucose, Bld 114 (*)    All other components within normal limits  CBC - Abnormal; Notable for the following components:   WBC 15.6 (*)    RBC 6.04 (*)    Hemoglobin 17.4 (*)    All other components within normal limits  RESP PANEL BY RT-PCR (RSV, FLU A&B, COVID)  RVPGX2  LIPASE, BLOOD  URINALYSIS, ROUTINE W REFLEX MICROSCOPIC                                                                                                                          Radiology CT ABDOMEN PELVIS W CONTRAST  Result Date: 06/28/2022 CLINICAL DATA:  Epigastric abdominal pain, remote appendectomy complicated by previous anterior epigastric abscess which required percutaneous drainage EXAM: CT ABDOMEN AND PELVIS WITH CONTRAST TECHNIQUE: Multidetector CT imaging  of the abdomen and pelvis was performed using the standard protocol following bolus administration of intravenous contrast. RADIATION DOSE REDUCTION: This exam was performed according to the departmental dose-optimization program which includes automated exposure control, adjustment of the mA and/or kV according to patient size and/or use of iterative reconstruction technique. CONTRAST:  24mL OMNIPAQUE IOHEXOL 350 MG/ML SOLN COMPARISON:  12/17/2021 FINDINGS:  Lower chest: No acute abnormality. Hepatobiliary: No focal hepatic abnormality or biliary obstruction pattern. Hepatic and portal veins are patent. Suspect noncalcified gallstone within the gallbladder measuring 2 cm. Common bile duct nondilated. Pancreas: Unremarkable. No pancreatic ductal dilatation or surrounding inflammatory changes. Spleen: Normal in size without focal abnormality. Adrenals/Urinary Tract: Adrenal glands are unremarkable. Kidneys are normal, without renal calculi, focal lesion, or hydronephrosis. Bladder is unremarkable. Stomach/Bowel: Negative for bowel obstruction, significant dilatation, ileus, or free air. Remote appendectomy. Majority of the colon is collapsed which may account for the apparent wall prominence versus mild wall thickening, alternatively difficult to exclude mild colitis. No free fluid, fluid collection, hemorrhage, hematoma, abscess or ascites. Vascular/Lymphatic: No significant vascular findings are present. No enlarged abdominal or pelvic lymph nodes. Reproductive: No significant finding by CT Other: No abdominal wall hernia or abnormality. No abdominopelvic ascites. Musculoskeletal: No acute or significant osseous findings. IMPRESSION: No significant acute intra-abdominal or pelvic finding by CT. Cholelithiasis Diffuse prominence of the colon versus mild wall thickening likely related to under distension. Difficult to exclude mild colitis. Remote appendectomy No recurrent abscess. Electronically Signed   By: Judie Petit.  Shick M.D.   On: 06/28/2022 13:24    Pertinent labs & imaging results that were available during my care of the patient were reviewed by me and considered in my medical decision making (see MDM for details).  Medications Ordered in ED Medications  lactated ringers bolus 1,000 mL (1,000 mLs Intravenous New Bag/Given 06/28/22 1253)  iohexol (OMNIPAQUE) 350 MG/ML injection 75 mL (75 mLs Intravenous Contrast Given 06/28/22 1312)  ketorolac (TORADOL) 15 MG/ML  injection 15 mg (15 mg Intravenous Given 06/28/22 1336)  dicyclomine (BENTYL) capsule 20 mg (20 mg Oral Given 06/28/22 1335)                                                                                                                                     Procedures Procedures  (including critical care time)  Medical Decision Making / ED Course   This patient presents to the ED for concern of abdominal pain, nausea, vomiting, diarrhea, this involves an extensive number of treatment options, and is a complaint that carries with it a high risk of complications and morbidity.  The differential diagnosis includes viral gastroenteritis, diverticulitis, colitis, pancreatitis, nephrolithiasis, UTI  MDM: Patient seen emerged part for evaluation of multiple complaints described above.  Physical exam with tenderness in the right and left lower quadrants.  Laboratory evaluation with leukocytosis to 15.6 with a hemoglobin of 17.4.  Urinalysis unremarkable.  COVID flu RSV  all negative.  Electrolytes unremarkable as well as lipase unremarkable.  CT abdomen pelvis with possible developing colitis but no acute life-threatening intra-abdominal pathology.  Patient fluid resuscitated and given Toradol and Bentyl and symptoms improved.  Patient able to tolerate p.o. prior to discharge.  Patient given return precautions and at this time does not meet inpatient criteria for admission and thus he was discharged with outpatient follow-up.   Additional history obtained: -Additional history obtained from wife -External records from outside source obtained and reviewed including: Chart review including previous notes, labs, imaging, consultation notes   Lab Tests: -I ordered, reviewed, and interpreted labs.   The pertinent results include:   Labs Reviewed  COMPREHENSIVE METABOLIC PANEL - Abnormal; Notable for the following components:      Result Value   Glucose, Bld 114 (*)    All other components within normal  limits  CBC - Abnormal; Notable for the following components:   WBC 15.6 (*)    RBC 6.04 (*)    Hemoglobin 17.4 (*)    All other components within normal limits  RESP PANEL BY RT-PCR (RSV, FLU A&B, COVID)  RVPGX2  LIPASE, BLOOD  URINALYSIS, ROUTINE W REFLEX MICROSCOPIC      Imaging Studies ordered: I ordered imaging studies including CT AP I independently visualized and interpreted imaging. I agree with the radiologist interpretation   Medicines ordered and prescription drug management: Meds ordered this encounter  Medications   lactated ringers bolus 1,000 mL   iohexol (OMNIPAQUE) 350 MG/ML injection 75 mL   ketorolac (TORADOL) 15 MG/ML injection 15 mg   dicyclomine (BENTYL) capsule 20 mg    -I have reviewed the patients home medicines and have made adjustments as needed  Critical interventions none    Cardiac Monitoring: The patient was maintained on a cardiac monitor.  I personally viewed and interpreted the cardiac monitored which showed an underlying rhythm of: Sinus tachycardia, NSR  Social Determinants of Health:  Factors impacting patients care include: none   Reevaluation: After the interventions noted above, I reevaluated the patient and found that they have :improved  Co morbidities that complicate the patient evaluation  Past Medical History:  Diagnosis Date   GERD (gastroesophageal reflux disease)       Dispostion: I considered admission for this patient, but he does not meet inpatient criteria for admission and was symptoms improved he is safe for discharge with outpatient follow-up     Final Clinical Impression(s) / ED Diagnoses Final diagnoses:  None     @PCDICTATION @    , MD 06/28/22 2002

## 2022-10-02 ENCOUNTER — Emergency Department (HOSPITAL_COMMUNITY)
Admission: EM | Admit: 2022-10-02 | Discharge: 2022-10-02 | Disposition: A | Discharge status: Home / Self Care | Payer: Self-pay | Attending: Emergency Medicine | Admitting: Emergency Medicine

## 2022-10-02 ENCOUNTER — Encounter (HOSPITAL_COMMUNITY): Payer: Self-pay

## 2022-10-02 ENCOUNTER — Other Ambulatory Visit: Payer: Self-pay

## 2022-10-02 DIAGNOSIS — R198 Other specified symptoms and signs involving the digestive system and abdomen: Secondary | ICD-10-CM | POA: Insufficient documentation

## 2022-10-02 LAB — HIV ANTIBODY (ROUTINE TESTING W REFLEX): HIV Screen 4th Generation wRfx: NONREACTIVE

## 2022-10-02 MED ORDER — METRONIDAZOLE 500 MG PO TABS
2000.0000 mg | ORAL_TABLET | Freq: Once | ORAL | Status: AC
  Administered 2022-10-02: 2000 mg via ORAL
  Filled 2022-10-02: qty 4

## 2022-10-02 MED ORDER — CEFTRIAXONE SODIUM 500 MG IJ SOLR
500.0000 mg | Freq: Once | INTRAMUSCULAR | Status: AC
  Administered 2022-10-02: 500 mg via INTRAMUSCULAR
  Filled 2022-10-02: qty 500

## 2022-10-02 MED ORDER — LIDOCAINE HCL (PF) 1 % IJ SOLN
INTRAMUSCULAR | Status: AC
  Administered 2022-10-02: 5 mL
  Filled 2022-10-02: qty 5

## 2022-10-02 MED ORDER — AZITHROMYCIN 250 MG PO TABS
1000.0000 mg | ORAL_TABLET | Freq: Once | ORAL | Status: AC
  Administered 2022-10-02: 1000 mg via ORAL
  Filled 2022-10-02: qty 4

## 2022-10-02 NOTE — ED Triage Notes (Signed)
Pt reports white rectal discharge, n/v and generally not feeling well.

## 2022-10-02 NOTE — ED Provider Notes (Signed)
[facility] Provider Note   CSN: [id number] Arrival date & time: 10/02/22  [id number]     History  Chief Complaint  Patient presents with   Vomiting    Brad Lyons is a 27 y.o. male.  27 yo M with a chief complaints of not feeling well.  Has had some white rectal discharge over the past couple days.  He denies overt abdominal pain denies nausea vomiting or diarrhea.  Denies dark stool or blood in his stool.  He does have a past history of hemorrhoids.  Otherwise has had no issue there.  He tells me that he does not have sex with other men and has not had anything inserted into his rectum.        Home Medications Prior to Admission medications   Medication Sig Start Date End Date Taking? Authorizing Provider  acetaminophen (TYLENOL) 500 MG tablet Take 500-1,000 mg by mouth every 6 (six) hours as needed for mild pain or headache (Tooth ache).    [provider]  amoxicillin-clavulanate (AUGMENTIN) 875-125 MG tablet Take 1 tablet by mouth every 12 (twelve) hours. Patient not taking: Reported on 06/28/2022 11/30/21   Maczis, Barth Kirks, PA-C  dicyclomine (BENTYL) 20 MG tablet Take 1 tablet (20 mg total) by mouth 2 (two) times daily. 06/28/22   Kommor, Madison, MD  ondansetron (ZOFRAN-ODT) 4 MG disintegrating tablet Take 1 tablet (4 mg total) by mouth every 8 (eight) hours as needed for nausea or vomiting. 06/28/22   Kommor, Madison, MD  oxyCODONE (OXY IR/ROXICODONE) 5 MG immediate release tablet Take 1 tablet (5 mg total) by mouth every 6 (six) hours as needed for breakthrough pain. Patient not taking: Reported on 06/28/2022 11/30/21   Maczis, Barth Kirks, PA-C  sodium chloride flush (NS) 0.9 % SOLN 5 mLs by Intracatheter route daily. Patient not taking: Reported on 06/28/2022 11/30/21   Jillyn Ledger, PA-C      Allergies    Patient has no known allergies.    Review of Systems   Review of Systems  Physical Exam Updated Vital  Signs BP (!) 145/101 (BP Location: Right Arm)   Pulse (!) 103   Temp 98.6 F (37 C) (Oral)   Resp 18   SpO2 99%  Physical Exam Vitals and nursing note reviewed.  Constitutional:      Appearance: He is well-developed.  HENT:     Head: Normocephalic and atraumatic.  Eyes:     Pupils: Pupils are equal, round, and reactive to light.  Neck:     Vascular: No JVD.  Cardiovascular:     Rate and Rhythm: Normal rate and regular rhythm.     Heart sounds: No murmur heard.    No friction rub. No gallop.  Pulmonary:     Effort: No respiratory distress.     Breath sounds: No wheezing.  Abdominal:     General: There is no distension.     Tenderness: There is no abdominal tenderness. There is no guarding or rebound.  Genitourinary:    Comments: I do not see any obvious hemorrhoids or fissures.  There is no obvious drainage.  Patient declined digital rectal exam Musculoskeletal:        General: Normal range of motion.     Cervical back: Normal range of motion and neck supple.  Skin:    Coloration: Skin is not pale.     Findings: No rash.  Neurological:     Mental Status: He is  alert and oriented to person, place, and time.  Psychiatric:        Behavior: Behavior normal.     ED Results / Procedures / Treatments   Labs (all labs ordered are listed, but only abnormal results are displayed) Labs Reviewed  RPR  HIV ANTIBODY (ROUTINE TESTING W REFLEX)  GC/CHLAMYDIA PROBE AMP ([place]) NOT AT [facility]  GC/CHLAMYDIA PROBE AMP ([place]) NOT AT [facility]    EKG None  Radiology No results found.  Procedures Procedures    Medications Ordered in ED Medications  cefTRIAXone (ROCEPHIN) injection 500 mg (has no administration in time range)  azithromycin (ZITHROMAX) tablet 1,000 mg (has no administration in time range)  metroNIDAZOLE (FLAGYL) tablet 2,000 mg (has no administration in time range)    ED Course/ Medical Decision Making/ A&P                             Medical  Decision Making Amount and/or Complexity of Data Reviewed Labs: ordered.  Risk Prescription drug management.   27 yo M with a chief complaints of white drainage from his rectum.  Going on for couple days.  I discussed with him the most likely diagnosis.  Will start him on antibiotics for presumptive sexually transmitted disease.  He does decline any anal receptive intercourse.  Denies having sex with other men.  I feel this is unlikely to be a abscess with fistula.  He has no history of recurrent issues, no history of bloody diarrhea.  Will have him follow-up with his family doctor in the office.  10:08 AM:  I have discussed the diagnosis/risks/treatment options with the patient.  Evaluation and diagnostic testing in the emergency department does not suggest an emergent condition requiring admission or immediate intervention beyond what has been performed at this time.  They will follow up with PCP. We also discussed returning to the ED immediately if new or worsening sx occur. We discussed the sx which are most concerning (e.g., sudden worsening pain, fever, inability to tolerate by mouth) that necessitate immediate return. Medications administered to the patient during their visit and any new prescriptions provided to the patient are listed below.  Medications given during this visit Medications  cefTRIAXone (ROCEPHIN) injection 500 mg (has no administration in time range)  azithromycin (ZITHROMAX) tablet 1,000 mg (has no administration in time range)  metroNIDAZOLE (FLAGYL) tablet 2,000 mg (has no administration in time range)     The patient appears reasonably screen and/or stabilized for discharge and I doubt any other medical condition or other [facility] requiring further screening, evaluation, or treatment in the ED at this time prior to discharge.          Final Clinical Impression(s) / ED Diagnoses Final diagnoses:  Rectal discharge    Rx / DC Orders ED Discharge Orders      None         Deno Etienne, DO 10/02/22 1008

## 2022-10-02 NOTE — Discharge Instructions (Signed)
The most common thing that would make you have discharge from your rectum would be a sexually transmitted disease.  I am going to test and treat you for these.  It usually takes a couple days for these test to come back.  He can check this on MyChart, someone typically would call you if it came back positive.  Please follow-up with your family doctor in the office.  If you have ongoing symptoms despite our treatment here and with negative test then please follow-up with your family doctor.  They may want to refer you to a gastroenterologist to be evaluated for a different possible pathology.  Please abstain from sexual contact for at least 2 weeks after treatment.  If you are positive you should let your sexual partners from up to 90 days ago know to be tested and treated prior to today.

## 2022-10-03 LAB — RPR: RPR Ser Ql: NONREACTIVE

## 2022-10-14 ENCOUNTER — Other Ambulatory Visit (HOSPITAL_COMMUNITY): Payer: Self-pay

## 2022-10-31 ENCOUNTER — Other Ambulatory Visit: Payer: Self-pay

## 2022-10-31 ENCOUNTER — Emergency Department (HOSPITAL_COMMUNITY)
Admission: EM | Admit: 2022-10-31 | Discharge: 2022-10-31 | Disposition: A | Discharge status: Home / Self Care | Payer: Self-pay | Attending: Emergency Medicine | Admitting: Emergency Medicine

## 2022-10-31 ENCOUNTER — Encounter (HOSPITAL_COMMUNITY): Payer: Self-pay

## 2022-10-31 DIAGNOSIS — M545 Low back pain, unspecified: Secondary | ICD-10-CM

## 2022-10-31 DIAGNOSIS — X500XXA Overexertion from strenuous movement or load, initial encounter: Secondary | ICD-10-CM | POA: Insufficient documentation

## 2022-10-31 DIAGNOSIS — S39012A Strain of muscle, fascia and tendon of lower back, initial encounter: Secondary | ICD-10-CM | POA: Insufficient documentation

## 2022-10-31 DIAGNOSIS — Y990 Civilian activity done for income or pay: Secondary | ICD-10-CM | POA: Insufficient documentation

## 2022-10-31 MED ORDER — NAPROXEN 375 MG PO TABS: 375.0000 mg | ORAL_TABLET | Freq: Two times a day (BID) | ORAL | 0 refills | Status: AC

## 2022-10-31 MED ORDER — METHOCARBAMOL 500 MG PO TABS: 500.0000 mg | ORAL_TABLET | Freq: Two times a day (BID) | ORAL | 0 refills | Status: AC

## 2022-10-31 NOTE — Discharge Instructions (Addendum)
At this time there does not appear to be the presence of an emergent medical condition, however there is always the potential for conditions to change. Please read and follow the below instructions.  Please return to the Emergency Department immediately for any new or worsening symptoms. Please be sure to follow up with your Primary Care Provider within one week regarding your visit today; please call their office to schedule an appointment even if you are feeling better for a follow-up visit. You may use the muscle relaxer Robaxin as prescribed to help with your symptoms.  Do not drive or operate heavy machinery while taking Robaxin as it will make you drowsy.  Do not drink alcohol or take other sedating medications while taking Robaxin as this will worsen side effects. You have been prescribed an NSAID-containing medication called Naproxen today.  Do not take the medications including ibuprofen, Aleve, Advil or other NSAID-containing medications while taking Naproxen.  Please be sure to drink enough water. You may follow-up with the on-call orthopedist Dr. Yevette Edwards for recheck regarding your back pain.  Please do not lift push or pull anything until you are cleared to do so by your primary care provider.    Please read the additional information packets attached to your discharge summary.  Go to the nearest Emergency Department immediately if: You have fever or chills You have pain that is not relieved with rest or medicine. You have increasing pain going down into your legs or buttocks. Your pain does not improve after  5 days. You have pain at night. You lose weight without trying. You have a fever or chills. You develop nausea or vomiting. You develop abdominal pain. You have any new/concerning or worsening of symptoms  Do not take your medicine if  develop an itchy rash, swelling in your mouth or lips, or difficulty breathing; call 911 and seek immediate emergency medical attention if  this occurs.  You may review your lab tests and imaging results in their entirety on your MyChart account.  Please discuss all results of fully with your primary care provider and other specialist at your follow-up visit.  Note: Portions of this text may have been transcribed using voice recognition software. Every effort was made to ensure accuracy; however, inadvertent computerized transcription errors may still be present.

## 2022-10-31 NOTE — ED Provider Notes (Signed)
[facility] EMERGENCY DEPARTMENT AT [facility] Provider Note   CSN: [id number] Arrival date & time: 10/31/22  1191     History  Chief Complaint  Patient presents with   Back Pain    Brad Lyons is a 27 y.o. male reports himself is otherwise healthy no known medication use presents for left low back pain.  Patient reports that he was lifting a heavy pole at work yesterday, he denies any fall or injury and denies having any pain yesterday after work.  He reports that this morning he woke up and noticed a tight pain to his left low back which does not radiate, pain worsens with certain motions and improves somewhat with stretching this morning.  Patient reports that he does not feel like he can go back to work and lift more today which is why he came to the emergency department.  He describes his pain as moderate and he has not tried any medications prior to arrival.  Pain does not radiate down the leg.  He denies any numbness or tingling.  He denies saddle area paresthesias, bowel/bladder incontinence, urinary retention, abdominal pain, dysuria/hematuria, fever, chills or any additional concerns.  HPI     Home Medications Prior to Admission medications   Medication Sig Start Date End Date Taking? Authorizing Provider  methocarbamol (ROBAXIN) 500 MG tablet Take 1 tablet (500 mg total) by mouth 2 (two) times daily for 7 days. 10/31/22 11/07/22 Yes Harlene Salts A, PA-C  naproxen (NAPROSYN) 375 MG tablet Take 1 tablet (375 mg total) by mouth 2 (two) times daily for 7 days. 10/31/22 11/07/22 Yes Harlene Salts A, PA-C  acetaminophen (TYLENOL) 500 MG tablet Take 500-1,000 mg by mouth every 6 (six) hours as needed for mild pain or headache (Tooth ache).    [provider]  amoxicillin-clavulanate (AUGMENTIN) 875-125 MG tablet Take 1 tablet by mouth every 12 (twelve) hours. Patient not taking: Reported on 06/28/2022 11/30/21   Maczis, Elmer Sow, PA-C  dicyclomine (BENTYL) 20 MG  tablet Take 1 tablet (20 mg total) by mouth 2 (two) times daily. 06/28/22   Kommor, Madison, MD  ondansetron (ZOFRAN-ODT) 4 MG disintegrating tablet Take 1 tablet (4 mg total) by mouth every 8 (eight) hours as needed for nausea or vomiting. 06/28/22   Kommor, Madison, MD  oxyCODONE (OXY IR/ROXICODONE) 5 MG immediate release tablet Take 1 tablet (5 mg total) by mouth every 6 (six) hours as needed for breakthrough pain. Patient not taking: Reported on 06/28/2022 11/30/21   Maczis, Elmer Sow, PA-C  sodium chloride flush (NS) 0.9 % SOLN 5 mLs by Intracatheter route daily. Patient not taking: Reported on 06/28/2022 11/30/21   Jacinto Halim, PA-C      Allergies    Patient has no known allergies.    Review of Systems   Review of Systems  Constitutional: Negative.  Negative for chills and fever.  Gastrointestinal: Negative.  Negative for abdominal pain.  Genitourinary:  Negative for dysuria and hematuria.  Musculoskeletal:  Positive for back pain.  Neurological: Negative.  Negative for weakness and numbness.       Denies saddle paresthesias, denies bowel/bladder incontinence, denies urinary retention    Physical Exam Updated Vital Signs BP (!) 136/92   Pulse 92   Temp 97.8 F (36.6 C) (Oral)   Resp 18   SpO2 97%  Physical Exam Constitutional:      General: He is not in acute distress.    Appearance: Normal appearance. He is well-developed. He  is not ill-appearing or diaphoretic.  HENT:     Head: Normocephalic and atraumatic.  Eyes:     General: Vision grossly intact. Gaze aligned appropriately.     Pupils: Pupils are equal, round, and reactive to light.  Neck:     Trachea: Trachea and phonation normal.  Pulmonary:     Effort: Pulmonary effort is normal. No respiratory distress.  Abdominal:     General: There is no distension.     Palpations: Abdomen is soft.     Tenderness: There is no abdominal tenderness. There is no guarding or rebound.  Musculoskeletal:        General:  Normal range of motion.     Cervical back: Normal range of motion.     Comments: Low back: Atraumatic in appearance, no overlying skin changes.  Normal alignment and posture.  No midline spinal tenderness palpation.  No crepitus step-off deformity the spine.  Left paralumbar muscular tenderness palpation.  No SI joint tenderness palpation.  No gluteal muscular tenderness palpation.  Full motion with flexion extension of lumbar spine without increased pain.  Left lateral bend and left rotation increased pain.  No increased pain with right lateral bend or right rotation.  Negative straight leg raise bilaterally.  Sensation intact and equal in all distributions of bilateral lower extremities.  5/5 strength with bilateral EHL, dorsi/plantarflexion, knee flexion/extension hip flexion.  Negative clonus of the feet bilaterally.  Equal and intact bilateral patellar and Achilles reflexes.  Strong pedal pulses.  Compartments soft.  Abdomen soft nontender.  Skin:    General: Skin is warm and dry.  Neurological:     Mental Status: He is alert.     GCS: GCS eye subscore is 4. GCS verbal subscore is 5. GCS motor subscore is 6.     Comments: Speech is clear and goal oriented, follows commands Major Cranial nerves without deficit, no facial droop Moves extremities without ataxia, coordination intact  Psychiatric:        Behavior: Behavior normal.     ED Results / Procedures / Treatments   Labs (all labs ordered are listed, but only abnormal results are displayed) Labs Reviewed - No data to display  EKG None  Radiology No results found.  Procedures Procedures    Medications Ordered in ED Medications - No data to display  ED Course/ Medical Decision Making/ A&P                             Medical Decision Making 27 year old male presented for left low back pain that began this morning after he woke up he notes that he was lifting a heavy pole at work yesterday but he had no pain yesterday and no  history of injury or trauma.  Pain does not radiate.  He has consistently reducible tenderness to the left paralumbar musculature without midline tenderness, crepitus, step-off or deformity.  Pain also is reproducible with motion such as left lateral bend and left rotation.  He has a reassuring neurologic examination today.  He is neurovascularly intact.  No associate abdominal tenderness dysuria or hematuria.  He is overall well-appearing in no acute distress and his vital signs are stable on room air.  Suspect patient is experiencing a left paralumbar muscular strain.  Patient agrees with this he reports that he used to play sports when he was younger and he feels this is muscular strain as well.  Patient is requesting a work note  because he does not want to do any more lifting at work today.  I will provide him with a work note along with a prescription for Robaxin and naproxen.  He will use warm compresses and I have encouraged him to follow-up with his primary care provider.  I have a low suspicion for cauda equina, AAA, dissection, spinal epidural abscess, kidney stone disease, myelopathy or other emergent pathologies at this time.  I reviewed previous ER visits, patient did have a visit for rectal discharge around a month ago when she was treated with antibiotics for, I do not feel this is related to his symptoms today and have a low suspicion for pancreatitis, diverticulitis or other intra-abdominal causes of his left low back pain at this time.  I did discuss strict ER precautions in regards to patient's symptoms today and he stated understanding.  All questions were answered.  Risk OTC drugs. Prescription drug management. Risk Details: Patient made aware of muscle relaxer precautions and stated understanding. Patient denies history of CKD, gastric ulcers, blood thinner use or adverse reaction to NSAIDs in the past.  Patient aware to avoid other NSAIDs while taking naproxen.    At this time  there does not appear to be any evidence of Brad acute emergency medical condition and the patient appears stable for discharge with appropriate outpatient follow up. Diagnosis was discussed with patient who verbalizes understanding of care plan and is agreeable to discharge. I have discussed return precautions with patient who verbalizes understanding. Patient encouraged to follow-up with their PCP. All questions answered.  Patient's case discussed with Dr. Renaye Rakers who agrees with plan to discharge with follow-up.   Note: Portions of this report may have been transcribed using voice recognition software. Every effort was made to ensure accuracy; however, inadvertent computerized transcription errors may still be present.         Final Clinical Impression(s) / ED Diagnoses Final diagnoses:  Acute left-sided low back pain without sciatica  Strain of lumbar region, initial encounter    Rx / DC Orders ED Discharge Orders          Ordered    methocarbamol (ROBAXIN) 500 MG tablet  2 times daily        10/31/22 0835    naproxen (NAPROSYN) 375 MG tablet  2 times daily        10/31/22 0835              Bill Salinas, PA-C 10/31/22 0850    Terald Sleeper, MD 10/31/22 [phone]

## 2022-10-31 NOTE — ED Triage Notes (Signed)
Pt came in via POV d/t lower back pain that he said started this morning, does have heavy lifting at his job. A.Ox4, rates pain 6/10.

## 2023-02-28 IMAGING — CT CT IMAGE GUIDED DRAINAGE BY PERCUTANEOUS CATHETER
1 of 3 series · 13 of 32 positions shown, 18 images · non-contrast
Comparison: none

CLINICAL DATA: History of ruptured appendicitis, appendectomy and
previous drainage of pelvic abscess. New abscess has formed in the
anterior epigastric region anterior to the stomach and abutting the
left lobe of the liver.

[Series 2: i-spiral 5.0 b40f · axial · 0.84mm/px · z∈[+1116,+1249]mm · 13 of 44 slices shown, 18 images]
[im 3/44  soft-tissue]
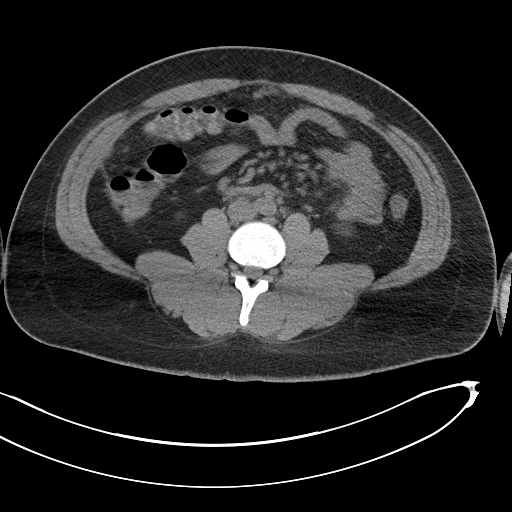
[im 3/44  bone]
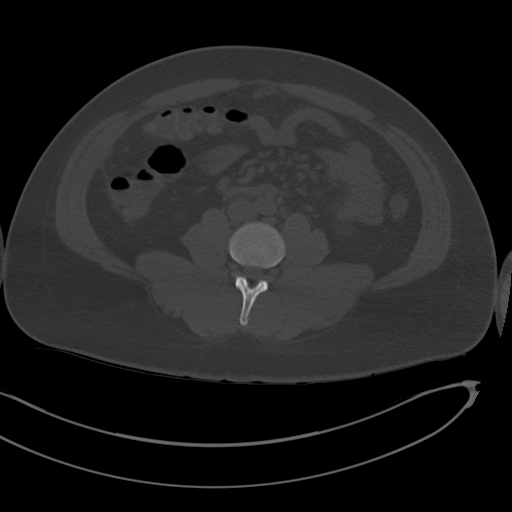
[im 8/44  soft-tissue]
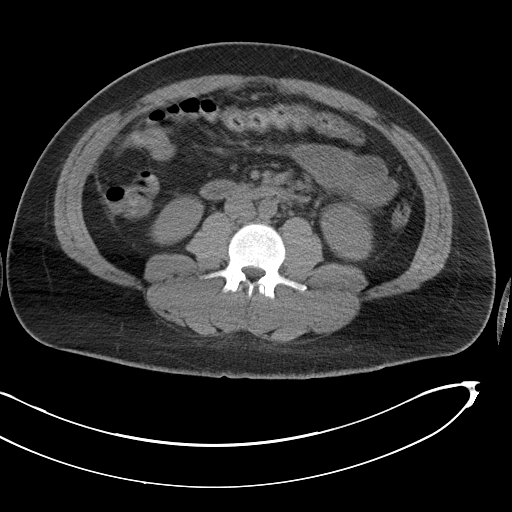
[im 10/44  soft-tissue]
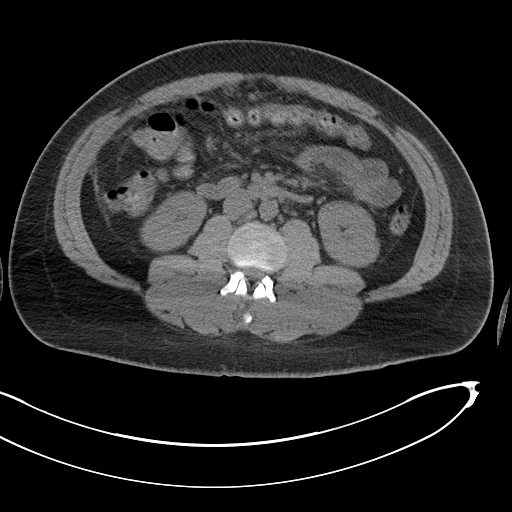
[im 12/44  soft-tissue]
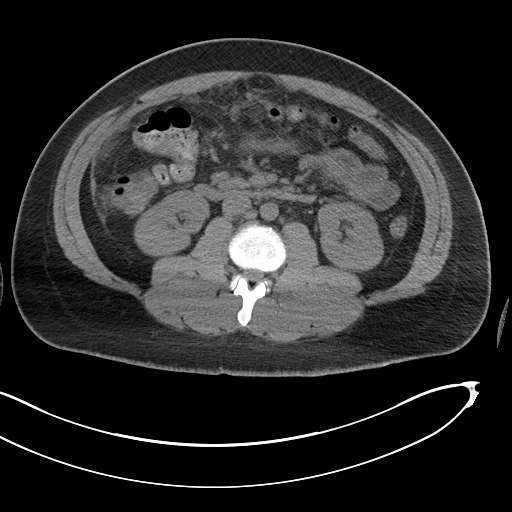
[im 17/44  soft-tissue]
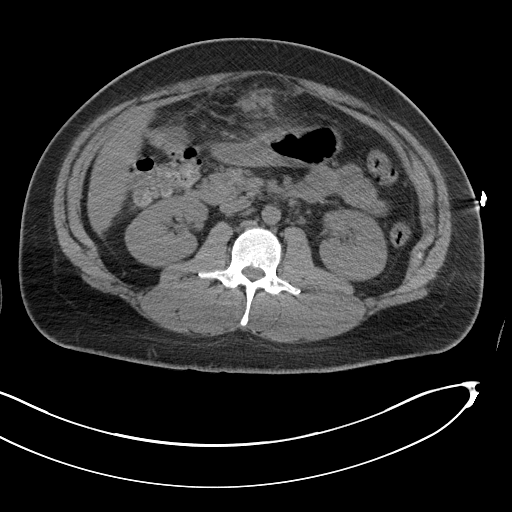
[im 20/44  soft-tissue]
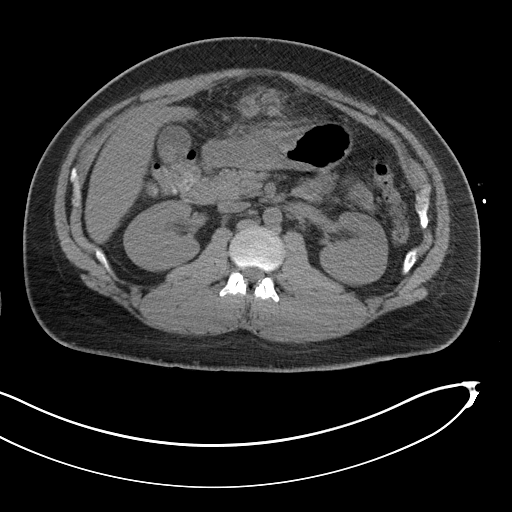
[im 24/44  soft-tissue]
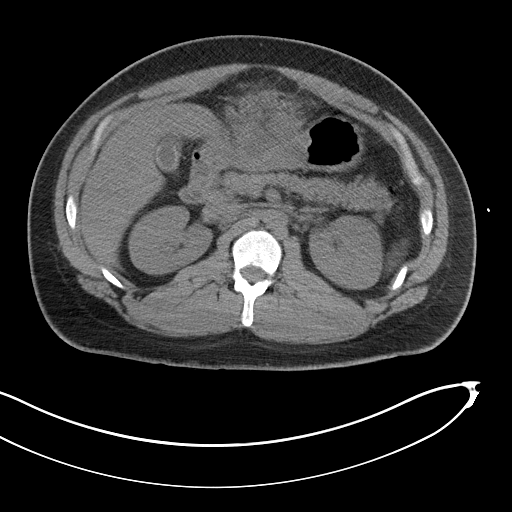
[im 27/44  soft-tissue]
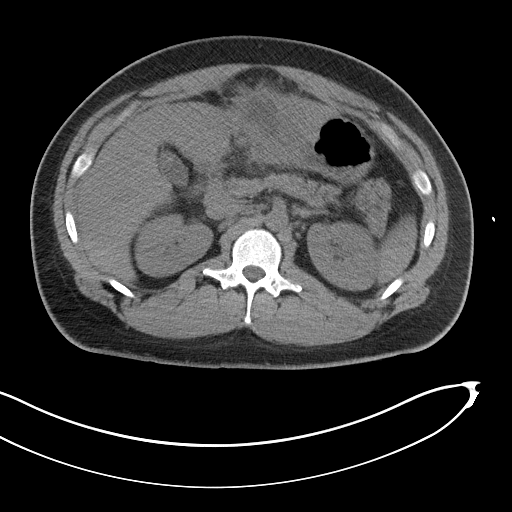
[im 32/44  soft-tissue]
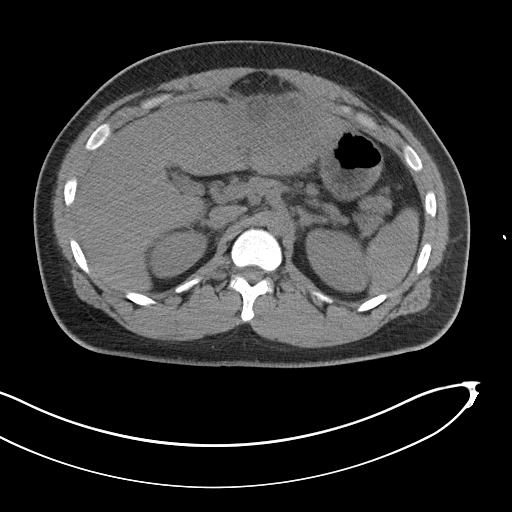
[im 32/44  bone]
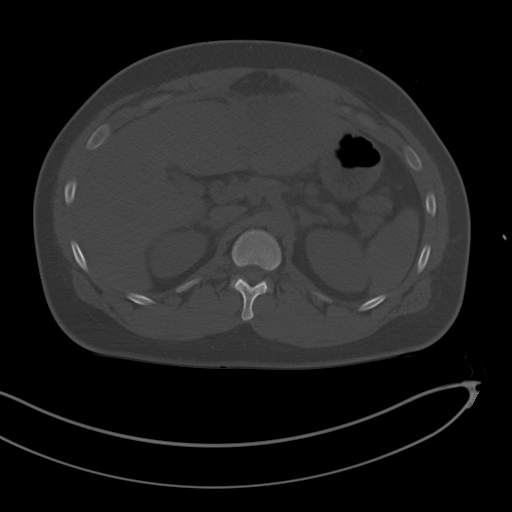
[im 34/44  soft-tissue]
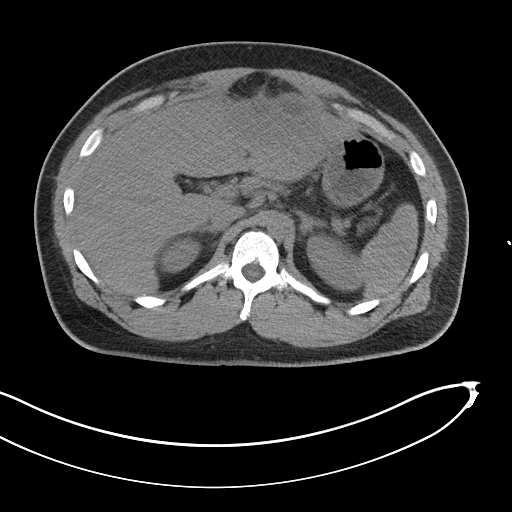
[im 34/44  lung]
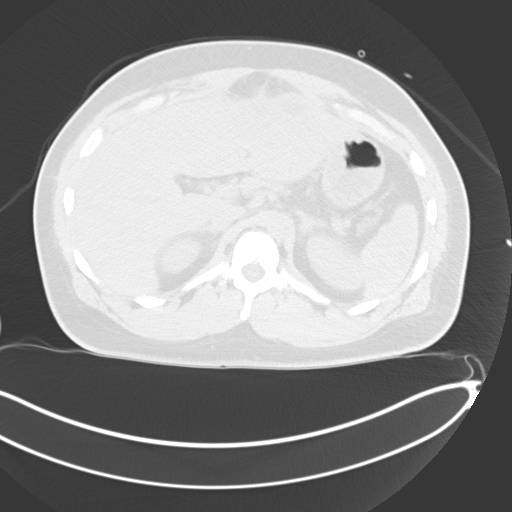
[im 36/44  soft-tissue]
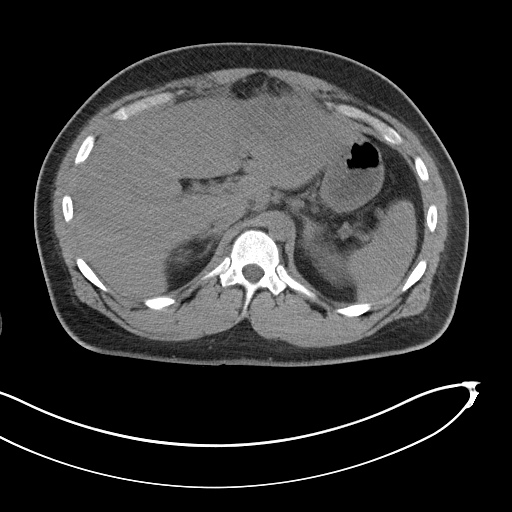
[im 36/44  lung]
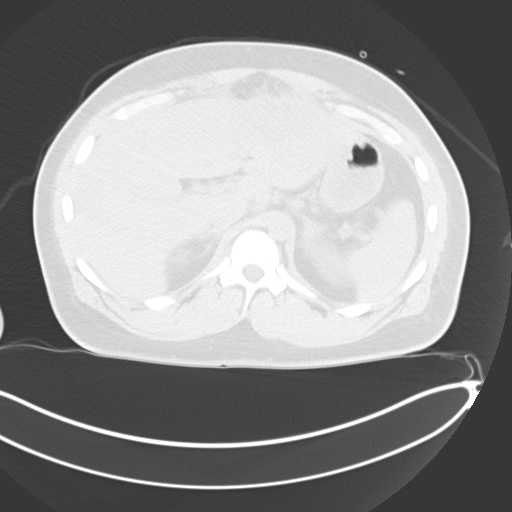
[im 39/44  lung]
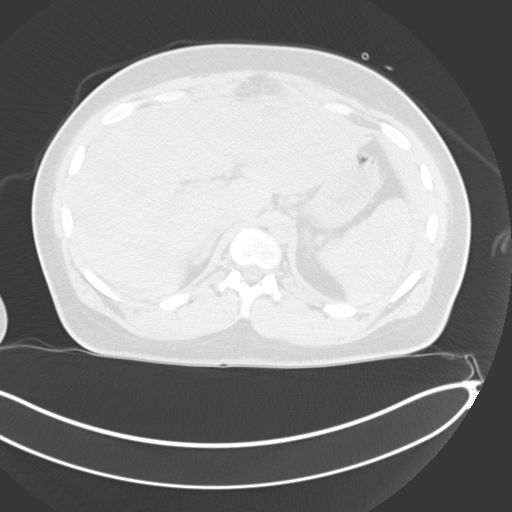
[im 41/44  soft-tissue]
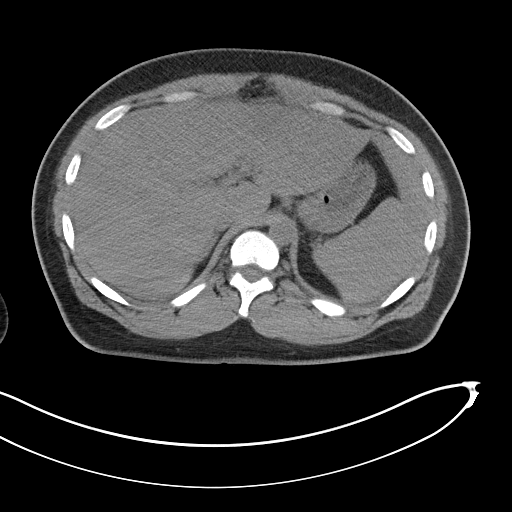
[im 41/44  lung]
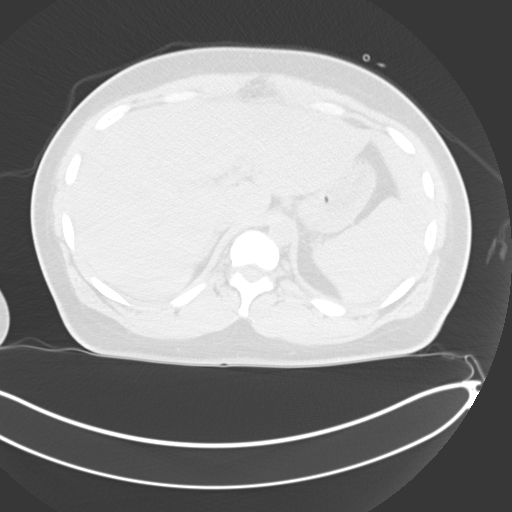

[13 of 32 positions shown; findings below may reference images not displayed]

EXAM:
CT GUIDED CATHETER DRAINAGE OF EPIGASTRIC PERITONEAL ABSCESS

ANESTHESIA/SEDATION:
Moderate (conscious) sedation was employed during this procedure. A
total of Versed 5.0 mg and Fentanyl 150 mcg was administered
intravenously by radiology nursing.

Moderate Sedation Time: 21 minutes. The patient's level of
consciousness and vital signs were monitored continuously by
radiology nursing throughout the procedure under my direct
supervision.

PROCEDURE:
The procedure, risks, benefits, and alternatives were explained to
the patient. Questions regarding the procedure were encouraged and
answered. The patient understands and consents to the procedure. A
time out was performed prior to initiating the procedure.

CT was performed through the upper abdomen in a supine position. The
epigastric region was prepped with chlorhexidine in a sterile
fashion, and a sterile drape was applied covering the operative
field. A sterile gown and sterile gloves were used for the
procedure. Local anesthesia was provided with 1% Lidocaine.

Under CT guidance, an 18 gauge trocar needle was advanced into an
anterior epigastric fluid collection anterior to the stomach and
adjacent to the left lobe of the liver. A small amount of fluid was
aspirated. A guidewire was advanced into the collection and the
needle removed. The percutaneous tract was dilated and a 10 French
percutaneous drainage catheter advanced over the wire. Catheter
position was confirmed by CT. Additional fluid sample was aspirated
and sent for culture analysis. The drain was flushed with saline and
attached to a suction bulb. The drainage catheter was secured at the
skin with a Prolene retention suture and StatLock device.

COMPLICATIONS:
None
FINDINGS: Aspiration at the level of the midline epigastric abscess yielded
grossly purulent fluid. After drain placement there is good return
of purulent fluid.
IMPRESSION: CT-guided percutaneous catheter drainage anterior epigastric abscess
yielding purulent fluid. A sample of fluid was sent for culture
analysis. A 10 French percutaneous drainage catheter was placed and
attached to suction bulb drainage.

RADIATION DOSE REDUCTION: This exam was performed according to the
departmental dose-optimization program which includes automated
exposure control, adjustment of the mA and/or kV according to
patient size and/or use of iterative reconstruction technique.

## 2023-03-22 IMAGING — CT CT ABD-PELV W/ CM
2 of 4 series · 12 of 46 positions shown, 14 images · IV contrast (agent unspecified)
Comparison: 11/24/2021, 11/25/2021

CLINICAL DATA: 26-year-old male with history of appendectomy
complicated postoperatively by anterior epigastric abscess formation
status post percutaneous drain placement on 11/25/2021

EXAM:
CT ABDOMEN AND PELVIS WITH CONTRAST
TECHNIQUE: Multidetector CT imaging of the abdomen and pelvis was performed
using the standard protocol following bolus administration of
intravenous contrast.

[Series 2: abd pelvis 5.00 br40 s3 axial · axial · 0.67mm/px · z∈[+1148,+1578]mm · 9 of 104 slices shown, 11 images]
[im 9/104  soft-tissue]
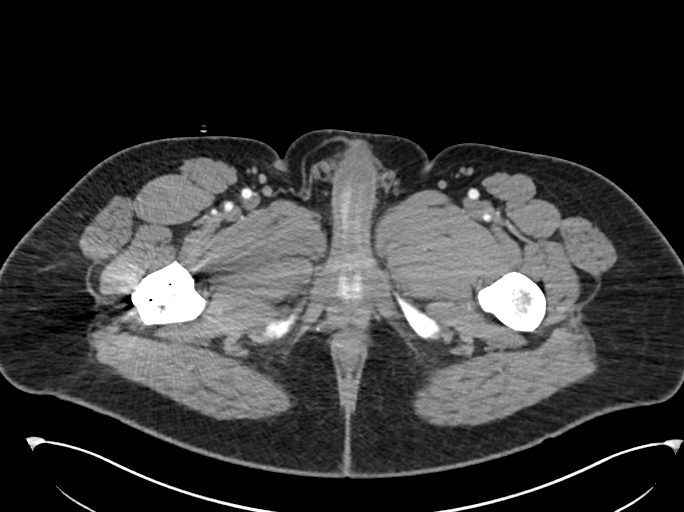
[im 9/104  bone]
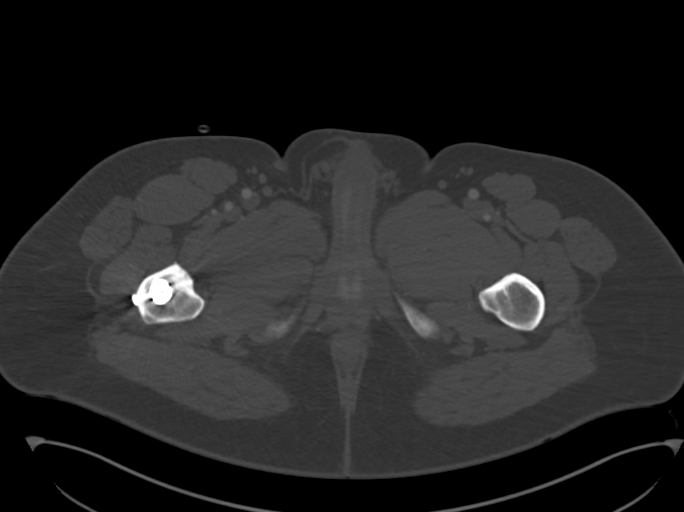
[im 18/104  soft-tissue]
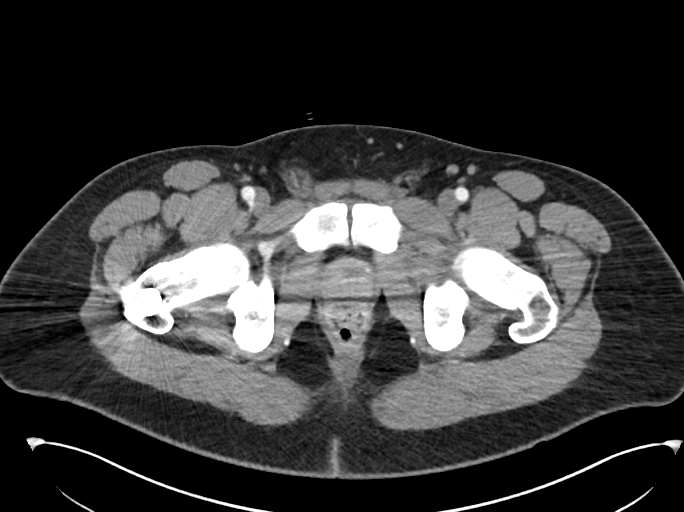
[im 31/104  soft-tissue]
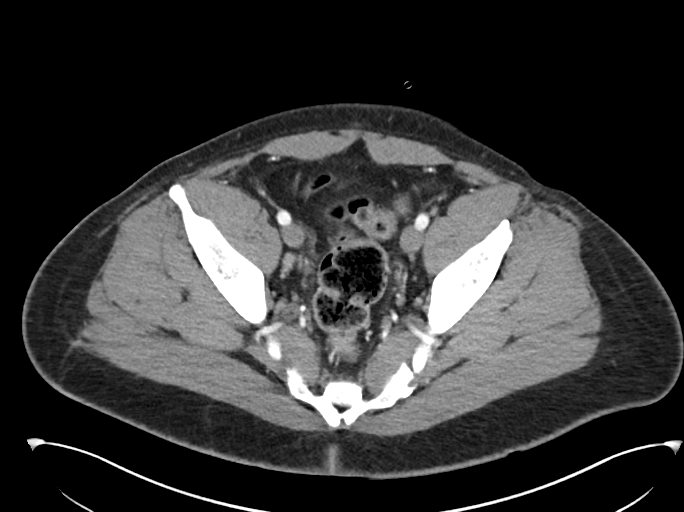
[im 39/104  soft-tissue]
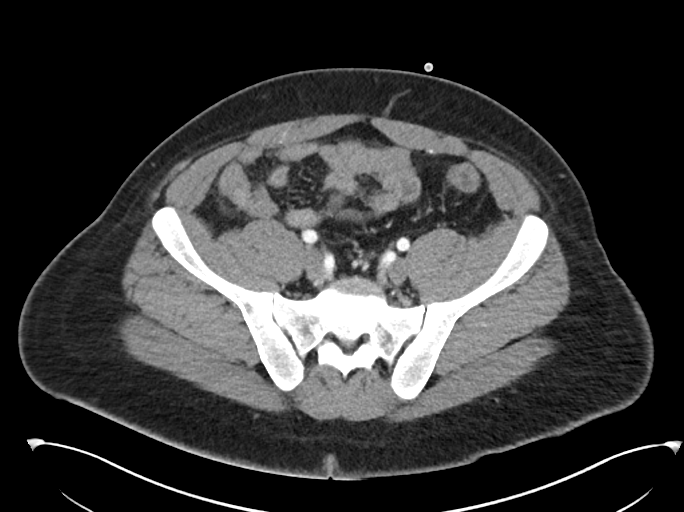
[im 52/104  soft-tissue]
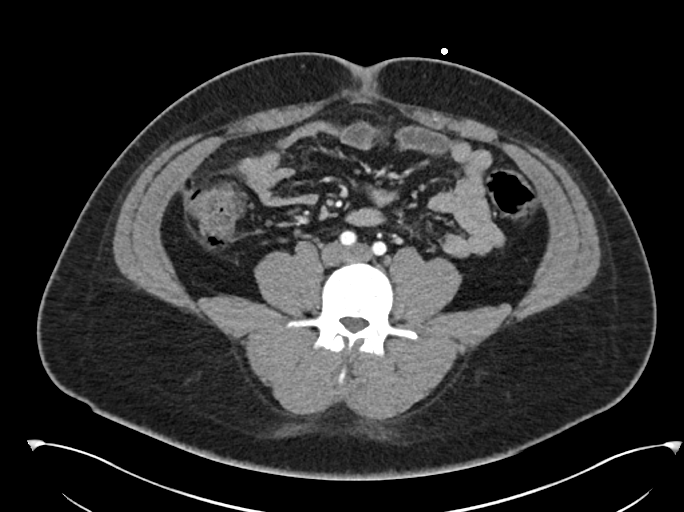
[im 65/104  soft-tissue]
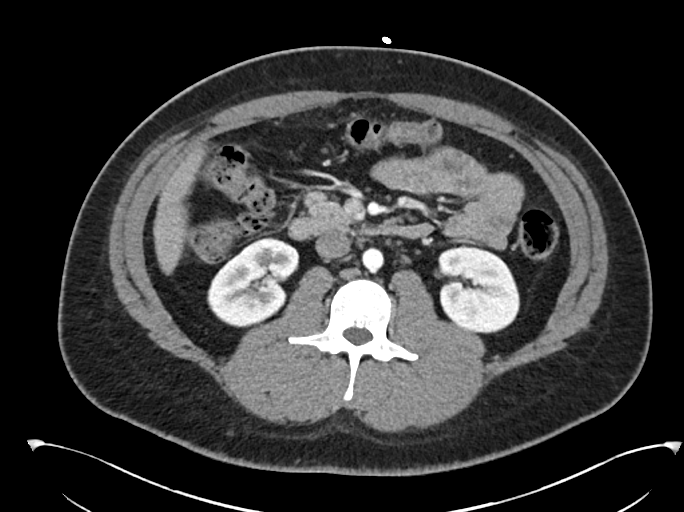
[im 73/104  soft-tissue]
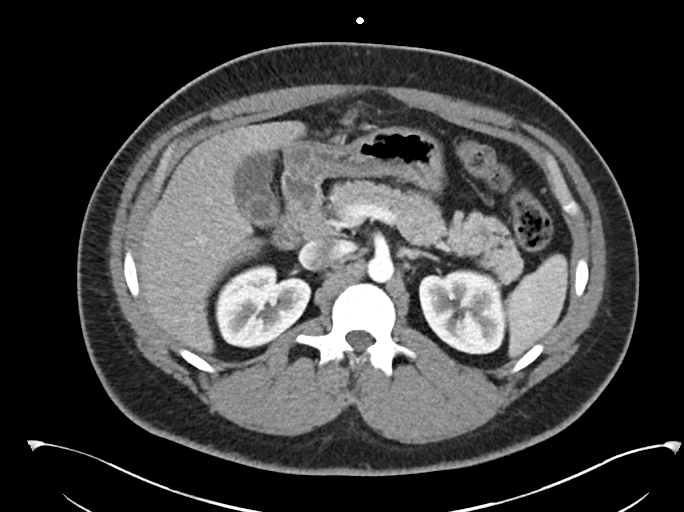
[im 86/104  soft-tissue]
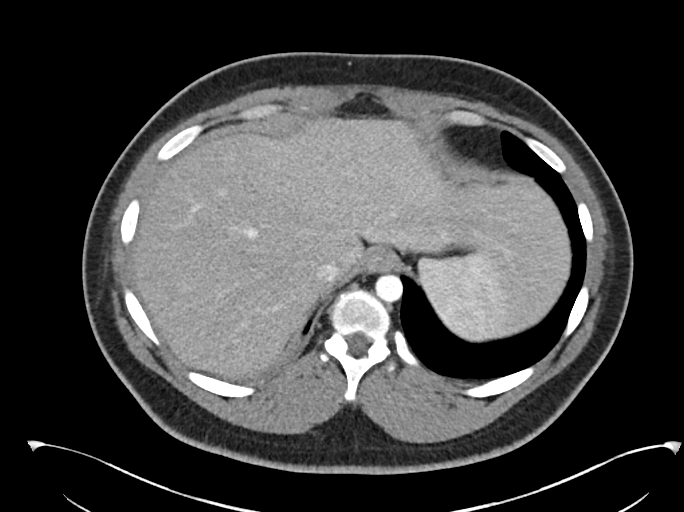
[im 95/104  soft-tissue]
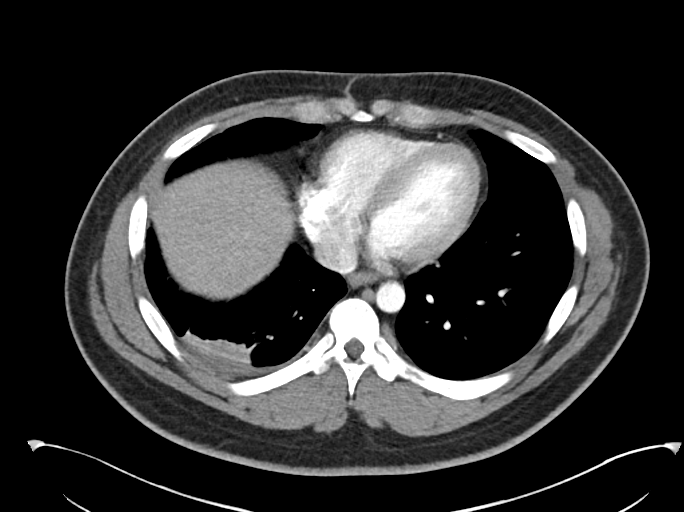
[im 95/104  bone]
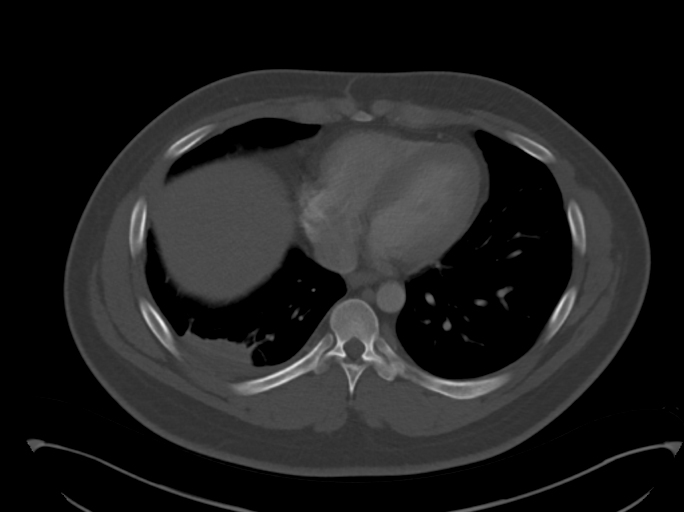

[Series 6: abd pelvis 2.00 br40 s3 cor · coronal · 0.89mm/px · 3 of 170 slices shown]
[im 57/170  soft-tissue]
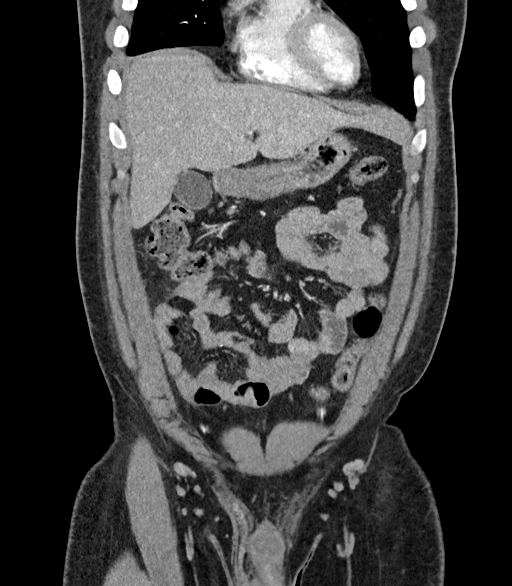
[im 76/170  soft-tissue]
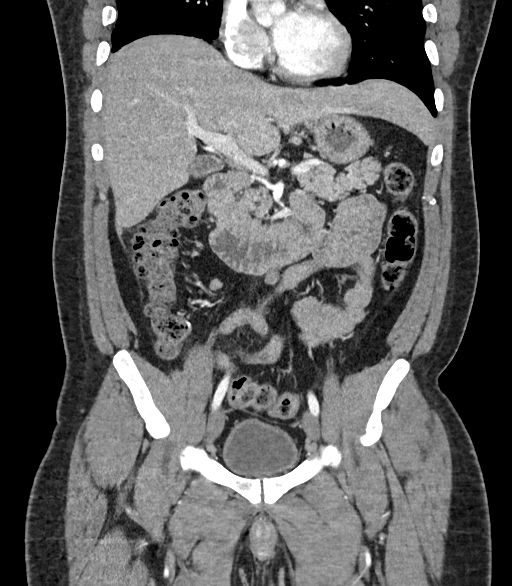
[im 94/170  soft-tissue]
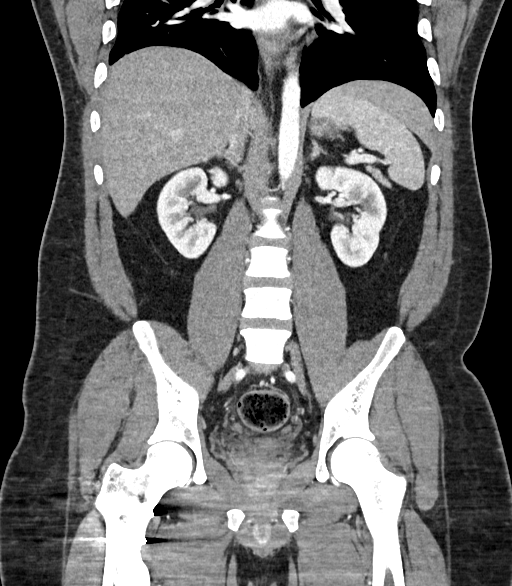

[12 of 46 positions shown; findings below may reference images not displayed]

RADIATION DOSE REDUCTION: This exam was performed according to the
departmental dose-optimization program which includes automated
exposure control, adjustment of the mA and/or kV according to
patient size and/or use of iterative reconstruction technique.

CONTRAST:  100mL TACMQ4-HZZ IOPAMIDOL (TACMQ4-HZZ) INJECTION 61%
FINDINGS: Lower chest: Improved, now trace right pleural effusion with
associated right basilar atelectasis, improved from comparison.

Hepatobiliary: Resolution of previously visualized left hepatic
fluid collection. Drain remains in place in unchanged position. The
remaining hepatic parenchyma is unremarkable. The gallbladder is
present, again containing a a peripherally calcified approximately
2.3 cm gallstone. No intra or extrahepatic biliary ductal dilation.

Pancreas: Unremarkable. No pancreatic ductal dilatation or
surrounding inflammatory changes.

Spleen: Normal in size without focal abnormality.

Adrenals/Urinary Tract: Adrenal glands are unremarkable. Kidneys are
normal, without renal calculi, focal lesion, or hydronephrosis.
Bladder is unremarkable.

Stomach/Bowel: Stomach is within normal limits. Appendix is
surgically absent, no complicating features. No evidence of bowel
wall thickening, distention, or inflammatory changes.

Vascular/Lymphatic: No significant vascular findings are present. No
enlarged abdominal or pelvic lymph nodes.

Reproductive: Prostate is unremarkable.

Other: No abdominal wall hernia or abnormality. No abdominopelvic
ascites.

Musculoskeletal: Partially visualized changes after right femoral
medullary nail placement. No acute osseous abnormality.
IMPRESSION: 1. Resolution of previously visualized left anterior hepatic fluid
collection with unchanged position of indwelling drain.
2. Improving, now trace, right pleural effusion with associated
right basilar atelectasis.

## 2023-12-24 ENCOUNTER — Ambulatory Visit (HOSPITAL_COMMUNITY)
Admission: EM | Admit: 2023-12-24 | Discharge: 2023-12-24 | Disposition: A | Discharge status: Home / Self Care | Attending: Emergency Medicine | Admitting: Emergency Medicine

## 2023-12-24 ENCOUNTER — Encounter (HOSPITAL_COMMUNITY): Payer: Self-pay

## 2023-12-24 ENCOUNTER — Telehealth (HOSPITAL_COMMUNITY): Payer: Self-pay | Admitting: *Deleted

## 2023-12-24 DIAGNOSIS — K0889 Other specified disorders of teeth and supporting structures: Secondary | ICD-10-CM | POA: Diagnosis not present

## 2023-12-24 MED ORDER — KETOROLAC TROMETHAMINE 30 MG/ML IJ SOLN
30.0000 mg | Freq: Once | INTRAMUSCULAR | Status: AC
  Administered 2023-12-24: 30 mg via INTRAMUSCULAR

## 2023-12-24 MED ORDER — KETOROLAC TROMETHAMINE 10 MG PO TABS: 10.0000 mg | ORAL_TABLET | Freq: Four times a day (QID) | ORAL | 0 refills | Status: AC | PRN

## 2023-12-24 MED ORDER — KETOROLAC TROMETHAMINE 30 MG/ML IJ SOLN
INTRAMUSCULAR | Status: AC
  Filled 2023-12-24: qty 1

## 2023-12-24 NOTE — Telephone Encounter (Signed)
Pt aware.

## 2023-12-24 NOTE — Discharge Instructions (Signed)
 You are given an injection of Toradol  in clinic today to help with your pain. Please go back to the dentist as advised at 12:00 today for further evaluation and management of this.

## 2023-12-24 NOTE — Telephone Encounter (Signed)
 Patient went to back the walk-in dentist and was told they could not do anything for him today. Patient called back to the clinic requesting pain medication until he can be seen by a dentist. Sent in prescription for ketorolac  tablets to [facility] on [address].

## 2023-12-24 NOTE — ED Provider Notes (Signed)
 [facility]    CSN: [id number] Arrival date & time: 12/24/23  1021      History   Chief Complaint Chief Complaint  Patient presents with   Dental Pain    HPI Brad Lyons is a 28 y.o. male.   Patient presents with left lower dental pain that began last night.  Patient states that his third molar on the lower left side has been cracked and the filling fell out a while back and he has had some intermittent pain since then, but the pain became worse last night.  Upon entering the room patient is cradling his chest due to the pain.  Patient denies any trauma and is unsure of how or when his tooth cracked.  Patient states he has been taking Aleve  with out relief.  Patient states that he did go to a walk-in dentist this morning and they told him to come back around 12 for evaluation and management of this.  Patient came here because he could not take the pain and needed something to relieve him until then.  The history is provided by the patient and medical records.  Dental Pain   Past Medical History:  Diagnosis Date   GERD (gastroesophageal reflux disease)     Patient Active Problem List   Diagnosis Date Noted   Intra-abdominal fluid collection 11/24/2021   Acute perforated appendicitis 10/25/2021    Past Surgical History:  Procedure Laterality Date   IR RADIOLOGIST EVAL & MGMT  11/12/2021   IR RADIOLOGIST EVAL & MGMT  12/17/2021   LAPAROSCOPIC APPENDECTOMY  10/25/2021   Procedure: APPENDECTOMY LAPAROSCOPIC;  Surgeon: Junie Olds, MD;  Location: [facility];  Service: General;;   LAPAROSCOPY N/A 10/25/2021   Procedure: LAPAROSCOPY DIAGNOSTIC;  Surgeon: Junie Olds, MD;  Location: [facility];  Service: General;  Laterality: N/A;       Home Medications    Prior to Admission medications   Not on File    Family History Family History  Problem Relation Age of Onset   Healthy Father     Social History Social History   Tobacco Use   Smoking  status: Never   Smokeless tobacco: Never  Vaping Use   Vaping status: Every Day  Substance Use Topics   Alcohol use: Yes    Comment: occasionally   Drug use: Yes    Types: Marijuana     Allergies   Patient has no known allergies.   Review of Systems Review of Systems  Per HPI  Physical Exam Triage Vital Signs ED Triage Vitals  Encounter Vitals Group     BP 12/24/23 1113 (!) 138/118     Systolic BP Percentile --      Diastolic BP Percentile --      Pulse Rate 12/24/23 1113 96     Resp 12/24/23 1113 18     Temp 12/24/23 1113 98.1 F (36.7 C)     Temp Source 12/24/23 1113 Oral     SpO2 12/24/23 1113 96 %     Weight 12/24/23 1112 240 lb (108.9 kg)     Height 12/24/23 1112 5\' 7"  (1.702 m)     Head Circumference --      Peak Flow --      Pain Score 12/24/23 1111 9     Pain Loc --      Pain Education --      Exclude from Growth Chart --    No data found.  Updated Vital Signs  BP (!) 138/118 (BP Location: Left Arm)   Pulse 96   Temp 98.1 F (36.7 C) (Oral) Comment: last had Aleve  yesterday  Resp 18   Ht 5\' 7"  (1.702 m)   Wt 240 lb (108.9 kg)   SpO2 96%   BMI 37.59 kg/m   Visual Acuity Right Eye Distance:   Left Eye Distance:   Bilateral Distance:    Right Eye Near:   Left Eye Near:    Bilateral Near:     Physical Exam Vitals and nursing note reviewed.  Constitutional:      General: He is awake. He is not in acute distress.    Appearance: Normal appearance. He is well-developed and well-groomed. He is not ill-appearing.  HENT:     Mouth/Throat:     Dentition: Abnormal dentition. Dental tenderness and dental caries present.     Comments: Left lower third molar is chipped and there is no filling present.  There is tenderness to the tooth itself and the surrounding gums. Skin:    General: Skin is warm and dry.  Neurological:     Mental Status: He is alert.  Psychiatric:        Behavior: Behavior is cooperative.      UC Treatments / Results   Labs (all labs ordered are listed, but only abnormal results are displayed) Labs Reviewed - No data to display  EKG   Radiology No results found.  Procedures Procedures (including critical care time)  Medications Ordered in UC Medications  ketorolac  (TORADOL ) 30 MG/ML injection 30 mg (30 mg Intramuscular Given 12/24/23 1122)    Initial Impression / Assessment and Plan / UC Course  I have reviewed the triage vital signs and the nursing notes.  Pertinent labs & imaging results that were available during my care of the patient were reviewed by me and considered in my medical decision making (see chart for details).     Patient is cradling his chin upon entering the room due to the pain.  Also resting well.  Blood pressure is elevated at 138/118 which is likely related to his pain.  Upon assessment the left lower third molar is chipped and there is no filling present.  There is tenderness to the tooth itself and the surrounding gums.  Given IM Toradol  in clinic.  Informed patient that he should continue with the plan to see a dentist at 12 PM today so they can evaluate and manage this further. Final Clinical Impressions(s) / UC Diagnoses   Final diagnoses:  Pain, dental     Discharge Instructions      You are given an injection of Toradol  in clinic today to help with your pain. Please go back to the dentist as advised at 12:00 today for further evaluation and management of this.  ED Prescriptions   None    I have reviewed the PDMP during this encounter.   Levora Reas A, NP 12/24/23 [phone]

## 2023-12-24 NOTE — Telephone Encounter (Signed)
 Pt states he wen tot dentist and they aren't able to help him today. He would like something for the pain states tylenol  and IBU OTC aren't helping.

## 2023-12-24 NOTE — ED Triage Notes (Signed)
 Patient presenting with left lower dental pain onset yesterday. States the tooth is cracked and the filling fell out. Denies any known oral trauma, unsure how the tooth cracked.  Prescriptions or OTC medications tried: Yes- Aleve    with no relief

## 2023-12-25 ENCOUNTER — Emergency Department (HOSPITAL_COMMUNITY)
Admission: EM | Admit: 2023-12-25 | Discharge: 2023-12-25 | Disposition: A | Discharge status: Home / Self Care | Attending: Emergency Medicine | Admitting: Emergency Medicine

## 2023-12-25 ENCOUNTER — Other Ambulatory Visit: Payer: Self-pay

## 2023-12-25 DIAGNOSIS — K0889 Other specified disorders of teeth and supporting structures: Secondary | ICD-10-CM | POA: Diagnosis present

## 2023-12-25 MED ORDER — KETOROLAC TROMETHAMINE 15 MG/ML IJ SOLN
15.0000 mg | Freq: Once | INTRAMUSCULAR | Status: AC
  Administered 2023-12-25: 15 mg via INTRAMUSCULAR
  Filled 2023-12-25: qty 1

## 2023-12-25 MED ORDER — OXYCODONE-ACETAMINOPHEN 5-325 MG PO TABS
1.0000 | ORAL_TABLET | Freq: Once | ORAL | Status: AC
  Administered 2023-12-25: 1 via ORAL
  Filled 2023-12-25: qty 1

## 2023-12-25 NOTE — ED Triage Notes (Signed)
  Patient presenting with left lower dental pain onset yesterday. States the tooth is cracked and the filling fell out. Denies any known oral trauma, unsure how the tooth cracked.  Prescriptions or OTC medications tried: pt has run out of aleve . Pt requesting pain meds here and will have his girlfriend come in and pick him up

## 2023-12-25 NOTE — Discharge Instructions (Addendum)
 You were seen in the emerged from today for evaluation of your dental pain.  Ultimately, you will need to follow-up with a dentist.  Please make sure you continue to take the antibiotics that you were given yesterday.  For pain, recommend 600 mg of ibuprofen and/or 1000 mg of Tylenol  every 6 hours.  He can also try orthodontic wax and placing it over the area.  Please make sure you see dentist tomorrow.  I do recommend soft foods such as applesauce, mashed potatoes, Jell-O, pudding, etc.  Please ensure you are staying well-hydrated drinking plenty of fluids mainly water.  Avoid smoking as this can worsen your dental pain.  If you have any other concerns, new or worsening symptoms, please return to the nearest Emergency Department for reevaluation.  Contact a dental care provider if: You have dental pain and you don't know why. Your pain isn't controlled with medicines. Your symptoms get worse. You have new symptoms. Get help right away if: You can't open your mouth. You're having trouble breathing or swallowing. You have a fever. Your face, neck, or jaw is swollen. These symptoms may be an emergency. Call 911 right away. Do not wait to see if the symptoms will go away. Do not drive yourself to the hospital.

## 2023-12-25 NOTE — ED Provider Notes (Signed)
 [facility] AT [facility] Provider Note   CSN: [id number] Arrival date & time: 12/25/23  1122     History No chief complaint on file.   Brad Lyons is a 28 y.o. male self reportedly otherwise healthy presents to the ER today for evaluation of dental pain for the past 2-3 days. He reports the filling has been missing for the past few weeks and was bothering him "some", but then started to worsen. Has not taken any pain medication today. He reports that he was supposed to see a walk in dentist yesterday, but they couldn't verify his insurance and told him to come in on Monday. He denies any trouble swallowing.  Denies any fevers.  Still able to eat but does cause him some pain.  He reports warmth actually feels good to the area.  HPI     Home Medications Prior to Admission medications   Medication Sig Start Date End Date Taking? Authorizing Provider  ketorolac  (TORADOL ) 10 MG tablet Take 1 tablet (10 mg total) by mouth every 6 (six) hours as needed for moderate pain (pain score 4-6) or severe pain (pain score 7-10). 12/24/23   Karon Packer, NP      Allergies    Patient has no known allergies.    Review of Systems   Review of Systems  Constitutional:  Negative for chills and fever.  HENT:  Positive for dental problem. Negative for congestion, facial swelling, rhinorrhea and trouble swallowing.   Respiratory:  Negative for shortness of breath.   Cardiovascular:  Negative for chest pain.  Musculoskeletal:  Negative for neck pain and neck stiffness.    Physical Exam Updated Vital Signs There were no vitals taken for this visit. Physical Exam Vitals and nursing note reviewed.  Constitutional:      General: He is not in acute distress.    Appearance: He is not ill-appearing or toxic-appearing.     Comments: On phone, no acute distress, but does appear to be mildly uncomfortable  HENT:     Head:     Comments: No facial or neck swelling noted.     Mouth/Throat:     Mouth: Mucous membranes are moist.     Comments: Moist mucous membranes.  Uvula midline.  Airway patent.  Controlling secretions.  Normal phonation.  No sublingual elevation.  Patient does have cracked back left lower molar.  No trismus. No palpable abscess.  No fluctuance induration. Cardiovascular:     Rate and Rhythm: Tachycardia present.  Pulmonary:     Effort: Pulmonary effort is normal. No respiratory distress.  Skin:    General: Skin is warm and dry.  Neurological:     Mental Status: He is alert.     ED Results / Procedures / Treatments   Labs (all labs ordered are listed, but only abnormal results are displayed) Labs Reviewed - No data to display  EKG None  Radiology No results found.  Procedures Procedures   Medications Ordered in ED Medications - No data to display  ED Course/ Medical Decision Making/ A&P    Medical Decision Making Risk Prescription drug management.   28 y.o. male presents to the ER today for evaluation of dental pain. Differential diagnosis includes but is not limited to dental pain, dental caries, abscess, ludwig's angina. Vital signs mild tachycardia, however his HR was not updated with pain medication. He was not ill appearing, likely pain related. Physical exam as noted above.   I do not  see any signs or symptoms concerning for deep space infection.  No sublingual elevation, doubt Ludwig's.  No facial or neck swelling noted.  I offered the patient a dental block but he declined.  His girlfriend will be picking him up so ordered shot of Toradol  and singular Percocet.  He does have follow-up appointment with dentist on Monday.  Encouraged him to keep taking the antibiotic he was given by urgent care yesterday.  We discussed pain management at home. I do not see any emergent cause need admission or further workup at this time.  Will discharge home with strict return precautions.   We discussed plan at bedside. We discussed  strict return precautions and red flag symptoms. The patient verbalized their understanding and agrees to the plan. The patient is stable and being discharged home in good condition.  Portions of this report may have been transcribed using voice recognition software. Every effort was made to ensure accuracy; however, inadvertent computerized transcription errors may be present.   Final Clinical Impression(s) / ED Diagnoses Final diagnoses:  Pain, dental    Rx / DC Orders ED Discharge Orders     None         Spence Dux, Kirby Peoples 12/25/23 2059    Arvilla Birmingham, MD 12/25/23 2128

## 2024-01-08 ENCOUNTER — Encounter (HOSPITAL_COMMUNITY): Payer: Self-pay

## 2024-01-08 ENCOUNTER — Other Ambulatory Visit: Payer: Self-pay

## 2024-01-08 ENCOUNTER — Emergency Department (HOSPITAL_COMMUNITY): Admission: EM | Admit: 2024-01-08 | Discharge: 2024-01-08 | Disposition: A | Discharge status: Home / Self Care

## 2024-01-08 DIAGNOSIS — Z202 Contact with and (suspected) exposure to infections with a predominantly sexual mode of transmission: Secondary | ICD-10-CM | POA: Insufficient documentation

## 2024-01-08 LAB — URINALYSIS, ROUTINE W REFLEX MICROSCOPIC
Bilirubin Urine: NEGATIVE
Glucose, UA: NEGATIVE mg/dL
Hgb urine dipstick: NEGATIVE
Ketones, ur: NEGATIVE mg/dL
Leukocytes,Ua: NEGATIVE
Nitrite: NEGATIVE
Protein, ur: NEGATIVE mg/dL
Specific Gravity, Urine: 1.017 (ref 1.005–1.030)
pH: 6 (ref 5.0–8.0)

## 2024-01-08 LAB — HIV ANTIBODY (ROUTINE TESTING W REFLEX): HIV Screen 4th Generation wRfx: NONREACTIVE

## 2024-01-08 MED ORDER — CEFTRIAXONE SODIUM 500 MG IJ SOLR
500.0000 mg | Freq: Once | INTRAMUSCULAR | Status: AC
  Administered 2024-01-08: 500 mg via INTRAMUSCULAR
  Filled 2024-01-08: qty 500

## 2024-01-08 MED ORDER — DOXYCYCLINE HYCLATE 100 MG PO TABS: 100.0000 mg | ORAL_TABLET | Freq: Two times a day (BID) | ORAL | 0 refills | Status: AC

## 2024-01-08 MED ORDER — METRONIDAZOLE 500 MG PO TABS
2000.0000 mg | ORAL_TABLET | Freq: Once | ORAL | Status: AC
  Administered 2024-01-08: 2000 mg via ORAL
  Filled 2024-01-08: qty 4

## 2024-01-08 MED ORDER — LIDOCAINE HCL (PF) 1 % IJ SOLN
1.0000 mL | Freq: Once | INTRAMUSCULAR | Status: AC
  Administered 2024-01-08: 1 mL
  Filled 2024-01-08: qty 5

## 2024-01-08 NOTE — ED Triage Notes (Signed)
 Pt came in via POV to request he get checked for STDs. Pt states his concern for this started a few hrs ago after having intercourse that his penis has little red markings on it & it kinda burns & feels irritated. A/Ox4, denies pain.

## 2024-01-08 NOTE — Discharge Instructions (Signed)
 You will be contacted with STI screening results if anything becomes positive.  Otherwise please continue take antibiotics as prescribed.

## 2024-01-08 NOTE — ED Provider Notes (Signed)
 [facility] AT [facility] Provider Note   CSN: [id number] Arrival date & time: 01/08/24  1708     Patient presents with: Request STD check   Brad Lyons is a 28 y.o. male for concerns of STI.  He presented to the emergency department after having sex with a male partner, has persistent penile irritation and stated that with the urinating he noticed discharge but does not have consistent discharge.   HPI     Prior to Admission medications   Medication Sig Start Date End Date Taking? Authorizing Provider  doxycycline (VIBRA-TABS) 100 MG tablet Take 1 tablet (100 mg total) by mouth 2 (two) times daily for 7 days. 01/08/24 01/15/24 Yes Myriam Dorn BROCKS, PA  ketorolac  (TORADOL ) 10 MG tablet Take 1 tablet (10 mg total) by mouth every 6 (six) hours as needed for moderate pain (pain score 4-6) or severe pain (pain score 7-10). 12/24/23   Johnie Rumaldo LABOR, NP    Allergies: Patient has no known allergies.    Review of Systems  Genitourinary:  Positive for penile pain.  All other systems reviewed and are negative.   Updated Vital Signs BP 133/85 (BP Location: Right Arm)   Pulse (!) 101   Temp 99.3 F (37.4 C)   Resp 16   Ht 5' 6 (1.676 m)   Wt 108.9 kg   SpO2 97%   BMI 38.74 kg/m   Physical Exam Vitals and nursing note reviewed.  HENT:     Head: Normocephalic and atraumatic.   Eyes:     General: No scleral icterus.    Conjunctiva/sclera: Conjunctivae normal.   Pulmonary:     Effort: Pulmonary effort is normal.  Abdominal:     General: Abdomen is flat.   Musculoskeletal:     Cervical back: Normal range of motion and neck supple.   Skin:    Findings: No rash.   Neurological:     Mental Status: He is alert.   Psychiatric:        Mood and Affect: Mood normal.     (all labs ordered are listed, but only abnormal results are displayed) Labs Reviewed  RPR  URINALYSIS, ROUTINE W REFLEX MICROSCOPIC  HIV ANTIBODY (ROUTINE TESTING  W REFLEX)  GC/CHLAMYDIA PROBE AMP ([place]) NOT AT [facility]    EKG: None  Radiology: No results found.   Procedures   Medications Ordered in the ED  cefTRIAXone  (ROCEPHIN ) injection 500 mg (has no administration in time range)  lidocaine  (PF) (XYLOCAINE ) 1 % injection 1-2.1 mL (has no administration in time range)  metroNIDAZOLE  (FLAGYL ) tablet 2,000 mg (has no administration in time range)                                    Medical Decision Making Amount and/or Complexity of Data Reviewed Labs: ordered.  Risk Prescription drug management.   Upon evaluation of this patient, will empirically treat for STI exposure given his presentation for same.  Screening test ordered as noted.  Patient to follow-up with primary care for continued symptoms.     Final diagnoses:  None    ED Discharge Orders          Ordered    doxycycline (VIBRA-TABS) 100 MG tablet  2 times daily        01/08/24 1805               Myriam,  Dorn BROCKS, PA 01/08/24 GUILLERMO    Melvenia Motto, MD 01/08/24 [phone]

## 2024-01-09 LAB — RPR: RPR Ser Ql: NONREACTIVE

## 2024-01-10 LAB — GC/CHLAMYDIA PROBE AMP (~~LOC~~) NOT AT ARMC
Chlamydia: NEGATIVE
Comment: NEGATIVE
Comment: NORMAL
Neisseria Gonorrhea: NEGATIVE
